# Patient Record
Sex: Male | Born: 1985 | Race: White | Hispanic: No | Marital: Single | Smoking: Never smoker
Health system: Southern US, Community
[De-identification: ages and names within clinical notes are randomized; demographics above are authoritative.]

## PROBLEM LIST (undated history)

## (undated) ENCOUNTER — Emergency Department (HOSPITAL_COMMUNITY): Admission: EM | Payer: 59 | Source: Home / Self Care

## (undated) DIAGNOSIS — I82409 Acute embolism and thrombosis of unspecified deep veins of unspecified lower extremity: Secondary | ICD-10-CM

## (undated) DIAGNOSIS — M549 Dorsalgia, unspecified: Secondary | ICD-10-CM

## (undated) HISTORY — DX: Acute embolism and thrombosis of unspecified deep veins of unspecified lower extremity: I82.409

---
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
## 2009-08-05 ENCOUNTER — Emergency Department (HOSPITAL_COMMUNITY): Admission: EM | Admit: 2009-08-05 | Discharge: 2009-08-05 | Payer: Self-pay | Admitting: Family Medicine

## 2011-03-24 LAB — GC/CHLAMYDIA PROBE AMP, GENITAL
Chlamydia, DNA Probe: POSITIVE — AB
GC Probe Amp, Genital: NEGATIVE

## 2019-02-08 ENCOUNTER — Emergency Department (HOSPITAL_COMMUNITY)
Admission: EM | Admit: 2019-02-08 | Discharge: 2019-02-08 | Disposition: A | Discharge status: Home / Self Care | Payer: 59 | Attending: Emergency Medicine | Admitting: Emergency Medicine

## 2019-02-08 ENCOUNTER — Emergency Department (HOSPITAL_COMMUNITY): Payer: 59

## 2019-02-08 ENCOUNTER — Other Ambulatory Visit: Payer: Self-pay

## 2019-02-08 ENCOUNTER — Encounter (HOSPITAL_COMMUNITY): Payer: Self-pay | Admitting: Emergency Medicine

## 2019-02-08 DIAGNOSIS — J09X2 Influenza due to identified novel influenza A virus with other respiratory manifestations: Secondary | ICD-10-CM | POA: Diagnosis not present

## 2019-02-08 DIAGNOSIS — F17200 Nicotine dependence, unspecified, uncomplicated: Secondary | ICD-10-CM | POA: Diagnosis not present

## 2019-02-08 DIAGNOSIS — J029 Acute pharyngitis, unspecified: Secondary | ICD-10-CM | POA: Diagnosis present

## 2019-02-08 DIAGNOSIS — J101 Influenza due to other identified influenza virus with other respiratory manifestations: Secondary | ICD-10-CM

## 2019-02-08 HISTORY — DX: Dorsalgia, unspecified: M54.9

## 2019-02-08 LAB — INFLUENZA PANEL BY PCR (TYPE A & B)
Influenza A By PCR: POSITIVE — AB
Influenza B By PCR: NEGATIVE

## 2019-02-08 LAB — GROUP A STREP BY PCR: Group A Strep by PCR: NOT DETECTED

## 2019-02-08 MED ORDER — ONDANSETRON 4 MG PO TBDP: 4.0000 mg | ORAL_TABLET | Freq: Three times a day (TID) | ORAL | 0 refills | Status: DC | PRN

## 2019-02-08 MED ORDER — OSELTAMIVIR PHOSPHATE 75 MG PO CAPS: 75.0000 mg | ORAL_CAPSULE | Freq: Two times a day (BID) | ORAL | 0 refills | Status: DC

## 2019-02-08 MED ORDER — IBUPROFEN 400 MG PO TABS
400.0000 mg | ORAL_TABLET | Freq: Once | ORAL | Status: AC
  Administered 2019-02-08: 400 mg via ORAL
  Filled 2019-02-08: qty 1

## 2019-02-08 MED ORDER — IBUPROFEN 400 MG PO TABS
ORAL_TABLET | ORAL | Status: AC
  Administered 2019-02-08: 400 mg via ORAL
  Filled 2019-02-08: qty 1

## 2019-02-08 NOTE — Discharge Instructions (Signed)
Take over the counter tylenol and ibuprofen, as directed on packaging, as needed for discomfort.  Gargle with warm water several times per day to help with discomfort.  May also use over the counter sore throat pain medicines such as chloraseptic or sucrets, as directed on packaging, as needed for discomfort. Take the prescriptions as directed.  Increase your fluid intake (ie:  Gatoraide) for the next few days.  Eat a bland diet and advance to your regular diet slowly as you can tolerate it. Call your regular medical doctor Monday to schedule a follow up appointment this week.  Return to the Emergency Department immediately sooner if worsening.  °

## 2019-02-08 NOTE — ED Triage Notes (Signed)
Pt states he has been having cough, sore throat, fever, body aches, and vomiting since Friday.  Girlfriend sick with same.

## 2019-02-08 NOTE — ED Provider Notes (Signed)
[facility] EMERGENCY DEPARTMENT Provider Note   CSN: [id number] Arrival date & time: 02/08/19  5701    History   Chief Complaint Chief Complaint  Patient presents with  . Generalized Body Aches    HPI Oscar Kaiser is a 33 y.o. male.     HPI  Pt was seen at 0755.  Per pt, c/o gradual onset and persistence of constant sore throat, runny/stuffy nose, sinus congestion, and cough for the past 2 days. Has been associated with several episodes of N/V, decreased appetite, generalized body aches/fatigue, and "chills." Pt states coughing is aggravating his chronic back pain.  Pt did not receive his flu shot this year. Pt's significant other with similar symptoms. Denies rash, no CP/SOB, no diarrhea, no black or blood in stools or emesis, no abd pain.    Past Medical History:  Diagnosis Date  . Back pain     There are no active problems to display for this patient.   History reviewed. No pertinent surgical history.      Home Medications    Prior to Admission medications   Not on File    Family History History reviewed. No pertinent family history.  Social History Social History   Tobacco Use  . Smoking status: Current Some Day Smoker  . Smokeless tobacco: Never Used  Substance Use Topics  . Alcohol use: Never    Frequency: Never  . Drug use: Never     Allergies   Patient has no known allergies.   Review of Systems Review of Systems ROS: Statement: All systems negative except as marked or noted in the HPI; Constitutional: Negative for objective fever and +chills, generalized body aches/fatigue.. ; ; Eyes: Negative for eye pain, redness and discharge. ; ; ENMT: Negative for ear pain, hoarseness, +nasal congestion, sinus pressure and sore throat. ; ; Cardiovascular: Negative for chest pain, palpitations, diaphoresis, dyspnea and peripheral edema. ; ; Respiratory: +cough. Negative for wheezing and stridor. ; ; Gastrointestinal: +N/V. Negative for diarrhea,  abdominal pain, blood in stool, hematemesis, jaundice and rectal bleeding. . ; ; Genitourinary: Negative for dysuria, flank pain and hematuria. ; ; Musculoskeletal: +chronic back pain. Negative for neck pain. Negative for swelling and trauma.; ; Skin: Negative for pruritus, rash, abrasions, blisters, bruising and skin lesion.; ; Neuro: Negative for headache, lightheadedness and neck stiffness. Negative for weakness, altered level of consciousness, altered mental status, extremity weakness, paresthesias, involuntary movement, seizure and syncope.       Physical Exam Updated Vital Signs BP 133/84 (BP Location: Right Arm)   Pulse 80   Temp (!) 100.6 F (38.1 C) (Oral)   Resp 18   Ht 6' (1.829 m)   Wt 95.3 kg   SpO2 100%   BMI 28.48 kg/m   Physical Exam 0800: Physical examination:  Nursing notes reviewed; Vital signs and O2 SAT reviewed;  Constitutional: Well developed, Well nourished, Well hydrated, In no acute distress; Head:  Normocephalic, atraumatic; Eyes: EOMI, PERRL, No scleral icterus; ENMT: TM's clear bilat. +edemetous nasal turbinates bilat with clear rhinorrhea. Mouth and pharynx without lesions. No tonsillar exudates. No intra-oral edema. No submandibular or sublingual edema. No hoarse voice, no drooling, no stridor. No pain with manipulation of larynx. No trismus. Mouth and pharynx normal, Mucous membranes moist; Neck: Supple, Full range of motion, No lymphadenopathy; Cardiovascular: Regular rate and rhythm, No gallop; Respiratory: Breath sounds clear & equal bilaterally, No wheezes.  Speaking full sentences with ease, Normal respiratory effort/excursion; Chest: Nontender, Movement normal; Abdomen: Soft,  Nontender, Nondistended, Normal bowel sounds; Genitourinary: No CVA tenderness; Spine:  No midline CS, TS, LS tenderness.;; Extremities: Peripheral pulses normal, No tenderness, No edema, No calf edema or asymmetry.; Neuro: AA&Ox3, Major CN grossly intact.  Speech clear. No gross focal  motor or sensory deficits in extremities.; Skin: Color normal, Warm, Dry.   ED Treatments / Results  Labs (all labs ordered are listed, but only abnormal results are displayed)   EKG None  Radiology   Procedures Procedures (including critical care time)  Medications Ordered in ED Medications  ibuprofen (ADVIL,MOTRIN) tablet 400 mg (400 mg Oral Given 02/08/19 0805)     Initial Impression / Assessment and Plan / ED Course  I have reviewed the triage vital signs and the nursing notes.  Pertinent labs & imaging results that were available during my care of the patient were reviewed by me and considered in my medical decision making (see chart for details).     MDM Reviewed: previous chart, nursing note and vitals Interpretation: labs and x-ray   Results for orders placed or performed during the hospital encounter of 02/08/19  Group A Strep by PCR  Result Value Ref Range   Group A Strep by PCR NOT DETECTED NOT DETECTED  Influenza panel by PCR (type A & B)  Result Value Ref Range   Influenza A By PCR POSITIVE (A) NEGATIVE   Influenza B By PCR NEGATIVE NEGATIVE   Dg Chest 2 View Result Date: 02/08/2019 CLINICAL DATA:  Cough sore throat body aches. EXAM: CHEST - 2 VIEW COMPARISON:  None. FINDINGS: Cardiomediastinal silhouette is normal. Mediastinal contours appear intact. There is no evidence of focal airspace consolidation, pleural effusion or pneumothorax. Osseous structures are without acute abnormality. Soft tissues are grossly normal. IMPRESSION: No active cardiopulmonary disease. Electronically Signed   By: Ted Mcalpine M.D.   On: 02/08/2019 07:40    0930:  Pt has tol PO well while in the ED without N/V. Pt symptomatically at this time. Dx and testing d/w pt.  Questions answered.  Verb understanding, agreeable to d/c home with outpt f/u.   Final Clinical Impressions(s) / ED Diagnoses   Final diagnoses:  None    ED Discharge Orders    None         Samuel Jester, DO 02/13/19 6644

## 2019-03-20 ENCOUNTER — Emergency Department (HOSPITAL_COMMUNITY): Payer: 59 | Admitting: Certified Registered Nurse Anesthetist

## 2019-03-20 ENCOUNTER — Other Ambulatory Visit: Payer: Self-pay

## 2019-03-20 ENCOUNTER — Encounter (HOSPITAL_COMMUNITY): Payer: Self-pay | Admitting: Certified Registered Nurse Anesthetist

## 2019-03-20 ENCOUNTER — Encounter (HOSPITAL_COMMUNITY)
Admission: EM | Disposition: A | Discharge status: Home / Self Care | Payer: Self-pay | Source: Home / Self Care | Attending: Emergency Medicine

## 2019-03-20 ENCOUNTER — Emergency Department (HOSPITAL_COMMUNITY)
Admission: EM | Admit: 2019-03-20 | Discharge: 2019-03-20 | Disposition: A | Discharge status: Home / Self Care | Payer: 59 | Attending: Urology | Admitting: Urology

## 2019-03-20 DIAGNOSIS — S3730XA Unspecified injury of urethra, initial encounter: Secondary | ICD-10-CM

## 2019-03-20 DIAGNOSIS — R310 Gross hematuria: Secondary | ICD-10-CM | POA: Insufficient documentation

## 2019-03-20 DIAGNOSIS — F17200 Nicotine dependence, unspecified, uncomplicated: Secondary | ICD-10-CM | POA: Insufficient documentation

## 2019-03-20 DIAGNOSIS — X58XXXA Exposure to other specified factors, initial encounter: Secondary | ICD-10-CM | POA: Diagnosis not present

## 2019-03-20 DIAGNOSIS — S39840A Fracture of corpus cavernosum penis, initial encounter: Secondary | ICD-10-CM

## 2019-03-20 HISTORY — PX: CYSTOSCOPY: SHX5120

## 2019-03-20 HISTORY — PX: REPAIR OF FRACTURED PENIS: SHX6063

## 2019-03-20 SURGERY — REPAIR, FRACTURE, PENIS
Anesthesia: General | Site: Urethra

## 2019-03-20 MED ORDER — CEFAZOLIN SODIUM-DEXTROSE 2-4 GM/100ML-% IV SOLN
2.0000 g | INTRAVENOUS | Status: AC
  Administered 2019-03-20: 2 g via INTRAVENOUS
  Filled 2019-03-20: qty 100

## 2019-03-20 MED ORDER — MIDAZOLAM HCL 2 MG/2ML IJ SOLN
INTRAMUSCULAR | Status: AC
  Filled 2019-03-20: qty 2

## 2019-03-20 MED ORDER — CEFAZOLIN (ANCEF) 1 G IV SOLR: 2.0000 g | INTRAVENOUS | Status: DC

## 2019-03-20 MED ORDER — PROMETHAZINE HCL 25 MG/ML IJ SOLN: 6.2500 mg | INTRAMUSCULAR | Status: DC | PRN

## 2019-03-20 MED ORDER — FENTANYL CITRATE (PF) 250 MCG/5ML IJ SOLN
INTRAMUSCULAR | Status: AC
Start: 2019-03-20 — End: ?
  Filled 2019-03-20: qty 5

## 2019-03-20 MED ORDER — LIDOCAINE 2% (20 MG/ML) 5 ML SYRINGE
INTRAMUSCULAR | Status: AC
  Filled 2019-03-20: qty 5

## 2019-03-20 MED ORDER — ONDANSETRON HCL 4 MG/2ML IJ SOLN
INTRAMUSCULAR | Status: AC
  Filled 2019-03-20: qty 2

## 2019-03-20 MED ORDER — LACTATED RINGERS IV SOLN
INTRAVENOUS | Status: DC | PRN
  Administered 2019-03-20 (×2): via INTRAVENOUS

## 2019-03-20 MED ORDER — ROCURONIUM BROMIDE 100 MG/10ML IV SOLN
INTRAVENOUS | Status: AC
  Filled 2019-03-20: qty 1

## 2019-03-20 MED ORDER — PROMETHAZINE HCL 25 MG/ML IJ SOLN
INTRAMUSCULAR | Status: AC
  Administered 2019-03-20: 09:00:00 25 mg via INTRAMUSCULAR
  Filled 2019-03-20: qty 1

## 2019-03-20 MED ORDER — SODIUM CHLORIDE (PF) 0.9 % IJ SOLN
INTRAMUSCULAR | Status: AC
  Filled 2019-03-20: qty 100

## 2019-03-20 MED ORDER — SUCCINYLCHOLINE CHLORIDE 200 MG/10ML IV SOSY
PREFILLED_SYRINGE | INTRAVENOUS | Status: AC
  Filled 2019-03-20: qty 10

## 2019-03-20 MED ORDER — OXYCODONE HCL 5 MG PO TABS: 5.0000 mg | ORAL_TABLET | Freq: Once | ORAL | Status: DC | PRN

## 2019-03-20 MED ORDER — OXYCODONE HCL 5 MG/5ML PO SOLN: 5.0000 mg | Freq: Once | ORAL | Status: DC | PRN

## 2019-03-20 MED ORDER — ACETAMINOPHEN 10 MG/ML IV SOLN
INTRAVENOUS | Status: AC
  Administered 2019-03-20: 1000 mg via INTRAVENOUS
  Filled 2019-03-20: qty 100

## 2019-03-20 MED ORDER — HYDROMORPHONE HCL 1 MG/ML IJ SOLN
0.2500 mg | INTRAMUSCULAR | Status: DC | PRN
  Administered 2019-03-20 (×2): 0.5 mg via INTRAVENOUS

## 2019-03-20 MED ORDER — BUPIVACAINE-EPINEPHRINE (PF) 0.25% -1:200000 IJ SOLN
INTRAMUSCULAR | Status: AC
  Filled 2019-03-20: qty 30

## 2019-03-20 MED ORDER — SUCCINYLCHOLINE CHLORIDE 200 MG/10ML IV SOSY
PREFILLED_SYRINGE | INTRAVENOUS | Status: DC | PRN
  Administered 2019-03-20: 120 mg via INTRAVENOUS

## 2019-03-20 MED ORDER — BUPIVACAINE HCL (PF) 0.25 % IJ SOLN
INTRAMUSCULAR | Status: AC
  Filled 2019-03-20: qty 30

## 2019-03-20 MED ORDER — 0.9 % SODIUM CHLORIDE (POUR BTL) OPTIME
TOPICAL | Status: DC | PRN
  Administered 2019-03-20: 1000 mL

## 2019-03-20 MED ORDER — LIDOCAINE 2% (20 MG/ML) 5 ML SYRINGE
INTRAMUSCULAR | Status: DC | PRN
  Administered 2019-03-20: 60 mg via INTRAVENOUS

## 2019-03-20 MED ORDER — PROPOFOL 10 MG/ML IV BOLUS
INTRAVENOUS | Status: AC
  Filled 2019-03-20: qty 40

## 2019-03-20 MED ORDER — DEXAMETHASONE SODIUM PHOSPHATE 10 MG/ML IJ SOLN
INTRAMUSCULAR | Status: AC
  Filled 2019-03-20: qty 1

## 2019-03-20 MED ORDER — BUPIVACAINE HCL (PF) 0.25 % IJ SOLN
INTRAMUSCULAR | Status: DC | PRN
  Administered 2019-03-20: 10 mL

## 2019-03-20 MED ORDER — DEXAMETHASONE SODIUM PHOSPHATE 4 MG/ML IJ SOLN
INTRAMUSCULAR | Status: DC | PRN
  Administered 2019-03-20: 10 mg via INTRAVENOUS

## 2019-03-20 MED ORDER — MEPERIDINE HCL 50 MG/ML IJ SOLN
INTRAMUSCULAR | Status: AC
  Filled 2019-03-20: qty 1

## 2019-03-20 MED ORDER — PROPOFOL 10 MG/ML IV BOLUS
INTRAVENOUS | Status: DC | PRN
  Administered 2019-03-20: 200 mg via INTRAVENOUS

## 2019-03-20 MED ORDER — ACETAMINOPHEN 10 MG/ML IV SOLN
1000.0000 mg | Freq: Once | INTRAVENOUS | Status: AC
  Administered 2019-03-20: 1000 mg via INTRAVENOUS

## 2019-03-20 MED ORDER — FENTANYL CITRATE (PF) 100 MCG/2ML IJ SOLN
INTRAMUSCULAR | Status: DC | PRN
  Administered 2019-03-20 (×2): 50 ug via INTRAVENOUS
  Administered 2019-03-20: 100 ug via INTRAVENOUS
  Administered 2019-03-20: 50 ug via INTRAVENOUS

## 2019-03-20 MED ORDER — SODIUM CHLORIDE 0.9 % IR SOLN
Status: DC | PRN
  Administered 2019-03-20: 3000 mL via INTRAVESICAL

## 2019-03-20 MED ORDER — CEFAZOLIN SODIUM-DEXTROSE 2-4 GM/100ML-% IV SOLN
INTRAVENOUS | Status: AC
  Filled 2019-03-20: qty 100

## 2019-03-20 MED ORDER — SODIUM CHLORIDE (PF) 0.9 % IJ SOLN
INTRAMUSCULAR | Status: DC | PRN
  Administered 2019-03-20: 60 mL

## 2019-03-20 MED ORDER — HYDROMORPHONE HCL 1 MG/ML IJ SOLN
INTRAMUSCULAR | Status: AC
  Administered 2019-03-20: 0.5 mg via INTRAVENOUS
  Filled 2019-03-20: qty 1

## 2019-03-20 MED ORDER — STERILE WATER FOR IRRIGATION IR SOLN
Status: DC | PRN
  Administered 2019-03-20: 250 mL

## 2019-03-20 MED ORDER — SUGAMMADEX SODIUM 200 MG/2ML IV SOLN
INTRAVENOUS | Status: AC
  Filled 2019-03-20: qty 2

## 2019-03-20 MED ORDER — PROMETHAZINE HCL 25 MG/ML IJ SOLN
25.0000 mg | Freq: Once | INTRAMUSCULAR | Status: AC
  Administered 2019-03-20: 09:00:00 25 mg via INTRAMUSCULAR

## 2019-03-20 MED ORDER — SUGAMMADEX SODIUM 200 MG/2ML IV SOLN
INTRAVENOUS | Status: DC | PRN
  Administered 2019-03-20: 200 mg via INTRAVENOUS

## 2019-03-20 MED ORDER — PROMETHAZINE HCL 25 MG/ML IJ SOLN
INTRAMUSCULAR | Status: AC
  Filled 2019-03-20: qty 1

## 2019-03-20 MED ORDER — CEPHALEXIN 500 MG PO CAPS: 500.0000 mg | ORAL_CAPSULE | Freq: Two times a day (BID) | ORAL | 0 refills | Status: AC

## 2019-03-20 MED ORDER — OXYCODONE-ACETAMINOPHEN 5-325 MG PO TABS: 1.0000 | ORAL_TABLET | Freq: Four times a day (QID) | ORAL | 0 refills | Status: DC | PRN

## 2019-03-20 MED ORDER — ONDANSETRON HCL 4 MG/2ML IJ SOLN
INTRAMUSCULAR | Status: DC | PRN
  Administered 2019-03-20: 4 mg via INTRAVENOUS

## 2019-03-20 MED ORDER — GLYCOPYRROLATE PF 0.2 MG/ML IJ SOSY
PREFILLED_SYRINGE | INTRAMUSCULAR | Status: AC
  Filled 2019-03-20: qty 1

## 2019-03-20 MED ORDER — MIDAZOLAM HCL 5 MG/5ML IJ SOLN
INTRAMUSCULAR | Status: DC | PRN
  Administered 2019-03-20: 2 mg via INTRAVENOUS

## 2019-03-20 MED ORDER — ROCURONIUM BROMIDE 10 MG/ML (PF) SYRINGE
PREFILLED_SYRINGE | INTRAVENOUS | Status: DC | PRN
  Administered 2019-03-20: 50 mg via INTRAVENOUS

## 2019-03-20 MED ORDER — MEPERIDINE HCL 50 MG/ML IJ SOLN
6.2500 mg | INTRAMUSCULAR | Status: DC | PRN
  Administered 2019-03-20: 12.5 mg via INTRAVENOUS

## 2019-03-20 SURGICAL SUPPLY — 41 items
BAG URINE DRAINAGE (UROLOGICAL SUPPLIES) ×4 IMPLANT
BLADE SURG 15 STRL LF DISP TIS (BLADE) ×2 IMPLANT
BLADE SURG 15 STRL SS (BLADE) ×2
BNDG COHESIVE 1X5 TAN STRL LF (GAUZE/BANDAGES/DRESSINGS) ×4 IMPLANT
BNDG CONFORM 4 STRL LF (GAUZE/BANDAGES/DRESSINGS) ×4 IMPLANT
BRIEF STRETCH FOR OB PAD LRG (UNDERPADS AND DIAPERS) ×4 IMPLANT
CATH FOLEY 2WAY SLVR  5CC 16FR (CATHETERS) ×2
CATH FOLEY 2WAY SLVR 5CC 16FR (CATHETERS) ×2 IMPLANT
DRAIN PENROSE 18X1/2 LTX STRL (DRAIN) ×4 IMPLANT
DRESSING TELFA ISLAND 4X8 (GAUZE/BANDAGES/DRESSINGS) ×4 IMPLANT
DRSG KUZMA FLUFF (GAUZE/BANDAGES/DRESSINGS) ×4 IMPLANT
ELECT PENCIL ROCKER SW 15FT (MISCELLANEOUS) ×4 IMPLANT
ELECT REM PT RETURN 15FT ADLT (MISCELLANEOUS) ×4 IMPLANT
GAUZE PETROLATUM 1 X8 (GAUZE/BANDAGES/DRESSINGS) ×4 IMPLANT
GAUZE SPONGE 4X4 12PLY STRL (GAUZE/BANDAGES/DRESSINGS) ×4 IMPLANT
GLOVE BIOGEL M STRL SZ7.5 (GLOVE) ×4 IMPLANT
GLOVE BIOGEL PI IND STRL 7.5 (GLOVE) ×2 IMPLANT
GLOVE BIOGEL PI INDICATOR 7.5 (GLOVE) ×2
GLOVE SURG SS PI 8.0 STRL IVOR (GLOVE) ×8 IMPLANT
GOWN STRL REIN XL XLG (GOWN DISPOSABLE) ×4 IMPLANT
GOWN STRL REUS W/TWL XL LVL3 (GOWN DISPOSABLE) ×4 IMPLANT
GUIDEWIRE STR DUAL SENSOR (WIRE) ×4 IMPLANT
KIT BASIN OR (CUSTOM PROCEDURE TRAY) ×4 IMPLANT
KIT TURNOVER KIT A (KITS) ×4 IMPLANT
NDL SAFETY ECLIPSE 18X1.5 (NEEDLE) ×2 IMPLANT
NEEDLE HYPO 18GX1.5 SHARP (NEEDLE) ×2
NEEDLE HYPO 22GX1.5 SAFETY (NEEDLE) ×4 IMPLANT
PACK BASIC VI WITH GOWN DISP (CUSTOM PROCEDURE TRAY) ×4 IMPLANT
PLUG CATH AND CAP STER (CATHETERS) ×4 IMPLANT
SPONGE LAP 4X18 RFD (DISPOSABLE) ×4 IMPLANT
SUT CHROMIC 3 0 PS 2 (SUTURE) IMPLANT
SUT CHROMIC 3 0 SH 27 (SUTURE) ×8 IMPLANT
SUT SILK 2 0 (SUTURE)
SUT SILK 2-0 18XBRD TIE 12 (SUTURE) IMPLANT
SUT VIC AB 2-0 SH 27 (SUTURE) ×2
SUT VIC AB 2-0 SH 27X BRD (SUTURE) ×2 IMPLANT
SUT VICRYL 3-0 RB1 18 ABS (SUTURE) ×4 IMPLANT
SYR 20CC LL (SYRINGE) ×4 IMPLANT
SYR 50ML LL SCALE MARK (SYRINGE) ×4 IMPLANT
SYR BULB IRRIGATION 50ML (SYRINGE) ×4 IMPLANT
SYR CONTROL 10ML LL (SYRINGE) ×4 IMPLANT

## 2019-03-20 NOTE — Op Note (Addendum)
Preoperative diagnosis: Penile swelling, gross hematuria Postoperative diagnosis: Penile fracture/corporal injury, urethral injury  Procedure: Flexible cystoscopy, penile exploration through circumcision, repair of complex penile injury including the right and left corpora and repair of the urethra  Surgeon: Junious Silk  Anesthesia: General  Indication for procedure: 33 year old male who was having sex with partner on top and had sudden penile pain, bending of penis, penile swelling ecchymosis and blood per meatus  Findings:  The penis was swollen with along the shaft and then the distal penis was significantly swollen and then bent about 90 to the left.  This ended up being from the right corporal rupture and hematoma.  The ventral penis and foreskin were the most swollen.  On flexible cystoscopy there was an injury of the penile urethra and I could not locate the proximal lumen despite cystoscopy and probing with a sensor wire.  A 16 French Foley catheter passed without difficulty and drained about 400 cc of urine.  The right corpora was torn in the mid penile urethra from about 2:00 around to 10:00 the urethra was torn from about 4:00 around to 1:00 and the left corpora was torn medially from 8:00 up to about 12:00.    He had a good straight erection on artificial erection after repair.  Description of procedure: After consent was obtained patient brought to the operating room.  After adequate anesthesia the external genitalia and lower abdomen were prepped and draped in the usual sterile fashion.  A timeout was performed to confirm the patient and procedure.  Testicles of the cystoscope per urethra and met an area of injury.  I could not locate a true lumen.  I thought I could see 1 superiorly or dorsally and tried to pass a sensor wire but it would not advance normally.  Therefore attention was turned to the penis and I drew a line around his prior circumcision scar.  A 10 cc plain Marcaine  dorsal penile block was given.  At the 6:00 ventral position the frenular line did not line up completely it was off by about 5 mm.  This was from his prior circumcision.  Because of the swelling it was easy to make an incision through the skin to bring this down through the dartos fascia.  I then decided to place a Foley catheter so I can at least palpate the distal urethra ventrally.  The Foley catheter actually advanced without difficulty and into the bladder.  It drained light red urine, but very thin and no clots.  The balloon was filled to 10 cc and it was seated at the bladder neck.  Having again urethral access we then dissected the dartos fascia and it quickly became apparent there was clot and a tear in the right corpora ventrally then the catheter was visible and on the other side of the urethra a small tear in the left corpora.  At these areas were oozing and so I used a Penrose drain to place a proximal penile tourniquet.  The injury was irrigated which allowed good visualization of the injury.  I started with 3-0 Vicryl suture in place and interrupted sutures in the left corpora.  These were tied down sequentially and this close the left corpora adequately.  Next, I decided to close the right corpora.  It was reapproximated with interrupted 3-0 Vicryl suture which re-created the right corpora.  The sutures were tied down sequentially. I then noted the left side of the urethra was intact so I carefully used the  needle of the 3-0 Vicryl to get the mucosa of the dorsal urethral plate proximally and then distally to re-create the 12 o'clock position.  I then used interrupted 3-0 Vicryl suture to reapproximate the remainder of the urethra.  I was happy with the repair.  I then used a 60 cc syringe of injectable saline and an 18-gauge needle into the left corpora and induced an artificial erection.  This created a good straight erection which is some mild oozing at the right corpora.  I placed another  interrupted suture and this corrected any leak.  I then irrigated the tissues again and could see where Buck's fascia was torn just from the left side across the urethra and over to the right corpora just distal to the repair.  Therefore I used another 3-0 Vicryl and reapproximated Buck's fascia to close it over the corpora and urethra providing a second layer of security.  I then released the tourniquet and hemostasis was adequate.  The wound was irrigated and because the dartos fascia was quite edematous I loosely reapproximated laterally and ventrally.  This began the line of the repair and I found my prior marks at the 12:00 and 6 o'clock position and tried to reapproximate the circumcision with interrupted 3-0 chromic suture the best I could.  It was quite swollen and still cocked to the left a bit even though the artificial erection had been straight.  The penis was irrigated and cleaned one last time.  I placed a Telfa around the incision and a loose clean dressing.  The Foley catheter was left to gravity drainage.  Fluffs and mesh underwear were placed.  He was awakened and taken to the recovery room in stable condition.  Complication: None  Blood loss: 50 cc  Drains: 16 French Foley catheter  Specimens: None  Disposition: Patient stable to PACU.  Called and discussed with his girlfriend Loma Sousa and we went over the procedure and all postop care including wound shower, medications and activity limitations with no sexual activity for 6 weeks.

## 2019-03-20 NOTE — ED Triage Notes (Signed)
Pt states he was having sex and feels he injured his penis, had some bleeding, none now.

## 2019-03-20 NOTE — H&P (Addendum)
H&P  Chief Complaint: Penile swelling, ecchymosis and blood per meatus  History of Present Illness: [name] is a 33 year old male who presents with the acute onset of penile swelling ecchymosis and blood per meatus.  He works second shift and after work had dinner, smoked some marijuana and then was having sex.  His partner was on top and he missed the vagina and had a sudden onset of severe penile pain, swelling and "blood went everywhere".  He notes blood per meatus.  He has not been able to void since the injury.  He has no prior urologic history or surgical history.  He continues to have significant penile pain swelling and inability to void.  Modifying factors: There are no other modifying factors  Associated signs and symptoms: There are no other associated signs and symptoms Aggravating and relieving factors: There are no other aggravating or relieving factors Severity: Moderate Duration: Persistent  Past Medical History:  Diagnosis Date  . Back pain    No past surgical history on file.  Home Medications:  (Not in a hospital admission)  Allergies: No Known Allergies  No family history on file. Social History:  reports that he has been smoking. He has never used smokeless tobacco. He reports that he does not drink alcohol or use drugs.  ROS: A complete review of systems was performed.  All systems are negative except for pertinent findings as noted. Review of Systems  All other systems reviewed and are negative.    Physical Exam:  Vital signs in last 24 hours: Temp:  [98.6 F (37 C)] 98.6 F (37 C) (04/03 0132) Pulse Rate:  [87] 87 (04/03 0132) Resp:  [18] 18 (04/03 0132) BP: (135)/(79) 135/79 (04/03 0132) SpO2:  [98 %] 98 % (04/03 0132) Weight:  [90.7 kg] 90.7 kg (04/03 0131) General:  Alert and oriented, No acute distress HEENT: Normocephalic, atraumatic Neck: No JVD or lymphadenopathy Cardiovascular: Regular rate and rhythm Lungs: Regular rate and  effort Abdomen: Soft, nontender, nondistended, no abdominal masses Back: No CVA tenderness Extremities: No edema Neurologic: Grossly intact GU: The penis 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  Prior circumcision scar noted.  Ecchymosis and focal tenderness on the left shaft.  Purple old blood/clot at meatus.  Laboratory Data:  No results found for this or any previous visit (from the past 24 hour(s)). No results found for this or any previous visit (from the past 240 hour(s)). Creatinine: No results for input(s): CREATININE in the last 168 hours.  Impression/Assessment:  Penile swelling, ecchymosis and blood per meatus- patient with difficulty voiding.  We discussed concern for penile and urethral injury.  Plan:  I discussed with the patient the nature, potential benefits, risks and alternatives to penile exploration via a circumcision and flexible cystoscopy, including side effects of the proposed treatment, the likelihood of the patient achieving the goals of the procedure, and any potential problems that might occur during the procedure or recuperation. We also discussed the risk of proceeding with possible exposure to novel coronavirus and the real risk of delay, including the expectation that a delay of 6-8 weeks or more may be required to emerge from an environment in which COVID-19 is less prevalent.  I recommended we proceed immediately.  Also discussed he may need a Foley catheter up to several weeks.  We discussed risks such as penile curvature and erectile dysfunction among others the degree to which should be lessened by formal repair if  an injury or tear is noted. All questions answered. Patient elects to proceed.  Addendum: Pt seen in pre-op area. He tried to void and have a BM but noted more penile swelling and pain. He was not able to pass urine. We discussed he may need an SP tube and I showed him where that goes. We discussed additional risk of  urethral stricture or fistula among others. All questions answered. He elects to proceed. I noted a small amount of Liquifed but thick dark blood emanating from meatus.   Jerilee Field 03/20/2019, 3:48 AM

## 2019-03-20 NOTE — ED Provider Notes (Signed)
[facility] EMERGENCY DEPARTMENT Provider Note   CSN: [id number] Arrival date & time: 03/20/19  0124    History   Chief Complaint Chief Complaint  Patient presents with  . Penis Injury    HPI Oscar Kaiser is a 33 y.o. male.  The history is provided by the patient.  He remains he was having sexual intercourse when his penis bent and he had sudden onset of pain and some bleeding.  Since then, he has noted that the penis is swollen.  Injury occurred about 30 minutes ago.  Past Medical History:  Diagnosis Date  . Back pain     There are no active problems to display for this patient.   No past surgical history on file.      Home Medications    Prior to Admission medications   Medication Sig Start Date End Date Taking? Authorizing Provider  ondansetron (ZOFRAN ODT) 4 MG disintegrating tablet Take 1 tablet (4 mg total) by mouth every 8 (eight) hours as needed for nausea or vomiting. 02/08/19   Samuel Jester, DO  oseltamivir (TAMIFLU) 75 MG capsule Take 1 capsule (75 mg total) by mouth every 12 (twelve) hours. 02/08/19   Samuel Jester, DO    Family History No family history on file.  Social History Social History   Tobacco Use  . Smoking status: Current Some Day Smoker  . Smokeless tobacco: Never Used  Substance Use Topics  . Alcohol use: Never    Frequency: Never  . Drug use: Never     Allergies   Patient has no known allergies.   Review of Systems Review of Systems  All other systems reviewed and are negative.    Physical Exam Updated Vital Signs BP 135/79 (BP Location: Left Arm)   Pulse 87   Temp 98.6 F (37 C) (Oral)   Resp 18   Ht 6' (1.829 m)   Wt 90.7 kg   SpO2 98%   BMI 27.12 kg/m   Physical Exam Vitals signs and nursing note reviewed.    33 year old male, resting comfortably and in no acute distress. Vital signs are normal. Oxygen saturation is 98%, which is normal. Head is normocephalic and atraumatic. PERRLA, EOMI.  Oropharynx is clear. Neck is nontender and supple without adenopathy or JVD. Back is nontender and there is no CVA tenderness. Lungs are clear without rales, wheezes, or rhonchi. Chest is nontender. Heart has regular rate and rhythm without murmur. Abdomen is soft, flat, nontender without masses or hepatosplenomegaly and peristalsis is normoactive. Genitalia: Circumcised penis.  Marked swelling of the shaft of the penis with generalized tenderness.  Testes descended without masses. Extremities have no cyanosis or edema, full range of motion is present. Skin is warm and dry without rash. Neurologic: Mental status is normal, cranial nerves are intact, there are no motor or sensory deficits.  ED Treatments / Results  Labs (all labs ordered are listed, but only abnormal results are displayed) Labs Reviewed - No data to display  EKG None  Radiology No results found.  Procedures Procedures   Medications Ordered in ED Medications - No data to display   Initial Impression / Assessment and Plan / ED Course  I have reviewed the triage vital signs and the nursing notes.  Pertinent labs & imaging results that were available during my care of the patient were reviewed by me and considered in my medical decision making (see chart for details).  Fracture of the penis.  Old records are  reviewed, and he has no relevant past visits.  Case is discussed with Dr. Mena Goes, on-call for urology, who is coming in to evaluate the patient.  Call received from Dr. Mena Goes, he has to do emergency surgery at [facility] and request the patient be transferred there.  Case has been discussed with Dr. Read Drivers, emergency department physician at [facility] who agrees to accept the patient in transfer.  He will go by private vehicle.  Final Clinical Impressions(s) / ED Diagnoses   Final diagnoses:  Fracture of erect penis    ED Discharge Orders    None       Dione Booze,  MD 03/20/19 0211

## 2019-03-20 NOTE — Transfer of Care (Signed)
Immediate Anesthesia Transfer of Care Note  Patient: Oscar Kaiser  Procedure(s) Performed: Procedure(s): PENILE EXPLORATION AND REPAIR OF FRACTURED PENIS (N/A) CYSTOSCOPY FLEXIBLE (N/A)  Patient Location: PACU  Anesthesia Type:General  Level of Consciousness:  sedated, patient cooperative and responds to stimulation  Airway & Oxygen Therapy:Patient Spontanous Breathing and Patient connected to face mask oxgen  Post-op Assessment:  Report given to PACU RN and Post -op Vital signs reviewed and stable  Post vital signs:  Reviewed and stable  Last Vitals:  Vitals:   03/20/19 0132  BP: 135/79  Pulse: 87  Resp: 18  Temp: 37 C  SpO2: 98%    Complications: No apparent anesthesia complications

## 2019-03-20 NOTE — Anesthesia Postprocedure Evaluation (Signed)
Anesthesia Post Note  Patient: Oscar Kaiser  Procedure(s) Performed: PENILE EXPLORATION AND REPAIR OF FRACTURED PENIS (N/A Penis) CYSTOSCOPY FLEXIBLE (N/A Urethra)     Patient location during evaluation: PACU Anesthesia Type: General Level of consciousness: awake and alert Pain management: pain level controlled Vital Signs Assessment: post-procedure vital signs reviewed and stable Respiratory status: spontaneous breathing, nonlabored ventilation, respiratory function stable and patient connected to nasal cannula oxygen Cardiovascular status: blood pressure returned to baseline and stable Postop Assessment: no apparent nausea or vomiting Anesthetic complications: no    Last Vitals:  Vitals:   03/20/19 0730 03/20/19 0745  BP: (!) 147/89   Pulse: 78 77  Resp: (!) 22 14  Temp:    SpO2: 100% 97%    Last Pain:  Vitals:   03/20/19 0715  TempSrc:   PainSc: 7                  Chakara Bognar COKER

## 2019-03-20 NOTE — Anesthesia Preprocedure Evaluation (Addendum)
Anesthesia Evaluation  Patient identified by MRN, date of birth, ID band Patient awake    Reviewed: Allergy & Precautions, NPO status , Patient's Chart, lab work & pertinent test results  Airway Mallampati: II  TM Distance: >3 FB Neck ROM: Full    Dental no notable dental hx.    Pulmonary neg pulmonary ROS, Current Smoker,    Pulmonary exam normal breath sounds clear to auscultation       Cardiovascular negative cardio ROS Normal cardiovascular exam Rhythm:Regular Rate:Normal     Neuro/Psych negative neurological ROS  negative psych ROS   GI/Hepatic negative GI ROS, Neg liver ROS,   Endo/Other  negative endocrine ROS  Renal/GU negative Renal ROS  negative genitourinary   Musculoskeletal negative musculoskeletal ROS (+)   Abdominal   Peds negative pediatric ROS (+)  Hematology negative hematology ROS (+)   Anesthesia Other Findings   Reproductive/Obstetrics negative OB ROS                             Anesthesia Physical Anesthesia Plan  ASA: II and emergent  Anesthesia Plan: General   Post-op Pain Management:    Induction: Intravenous  PONV Risk Score and Plan: 1 and Ondansetron  Airway Management Planned: Oral ETT  Additional Equipment:   Intra-op Plan:   Post-operative Plan: Extubation in OR  Informed Consent: I have reviewed the patients History and Physical, chart, labs and discussed the procedure including the risks, benefits and alternatives for the proposed anesthesia with the patient or authorized representative who has indicated his/her understanding and acceptance.     Dental advisory given  Plan Discussed with: CRNA  Anesthesia Plan Comments:        Anesthesia Quick Evaluation

## 2019-03-20 NOTE — Anesthesia Procedure Notes (Signed)
Procedure Name: Intubation Date/Time: 03/20/2019 5:58 AM Performed by: Vanessa [place], CRNA Pre-anesthesia Checklist: Patient identified, Emergency Drugs available, Suction available and Patient being monitored Patient Re-evaluated:Patient Re-evaluated prior to induction Oxygen Delivery Method: Circle system utilized Preoxygenation: Pre-oxygenation with 100% oxygen Induction Type: IV induction and Rapid sequence Laryngoscope Size: 2 and Miller Grade View: Grade I Tube type: Oral Tube size: 7.5 mm Number of attempts: 1 Airway Equipment and Method: Stylet Placement Confirmation: ETT inserted through vocal cords under direct vision,  positive ETCO2 and breath sounds checked- equal and bilateral Tube secured with: Tape Dental Injury: Teeth and Oropharynx as per pre-operative assessment

## 2019-03-20 NOTE — Discharge Instructions (Addendum)
Circumcision and Penile/Urethral injury Repair, Adult, Care After This sheet gives you information about how to care for yourself after your procedure. Your doctor may also give you more specific instructions. If you have problems or questions, contact your doctor. Follow these instructions at home: Cut care   Follow instructions from your doctor about how to take care of your cut from surgery (incision). Make sure you: ? Wash your hands with soap and water before you change your bandage (dressing). If you cannot use soap and water, use hand sanitizer. ? Change your bandage as told by your doctor. ? Leave stitches (sutures), skin glue, or skin tape (adhesive) strips in place. They may need to stay in place for 2 weeks or longer. If tape strips get loose and curl up, you may trim the loose edges. Do not remove tape strips completely unless your doctor says it is okay  Check your cut area every day for signs of infection. Check for: ? More redness, swelling, or pain. ? More fluid or blood. ? Warmth. ? Pus or a bad smell. Bathing  Do not take baths, swim, or use a hot tub until your doctor says it is okay.   You may shower tomorrow, March 21, 2019. Let water run off the incision/penis and the catheter.   Do not get your cut area wet during the 24 hours after the procedure, or as long as told by your doctor.  When you can shower, do not rub the cut area. Gently pat it dry. General instructions  Avoid heavy lifting, contact sports, biking, or swimming until your doctor says it is okay.\  No sexual activity for 6 weeks   Take or apply over-the-counter and prescription medicines only as told by your doctor.  Keep all follow-up visits as told by your doctor. This is important. Contact a doctor if:  Medicine does not help your pain.  You have more redness, swelling, or pain around your cut.  You have more fluid or blood coming from your cut.  Your cut feels warm to the touch.  You  have pus or a bad smell coming from your cut.  You have a fever. Get help right away if:  You cannot pee (urinate).  It hurts to pee.  Your pain is not helped by medicines.  There is redness, swelling, and soreness that spreads up the shaft of your penis, your thighs, or your lower belly (abdomen).  You have bleeding that does not stop when you press on it. Summary  Follow instructions from your doctor about how to take care of your cut from surgery (incision).  Check your cut area every day for signs of infection.  Do not have sex until your doctor says it is okay. This information is not intended to replace advice given to you by your health care provider. Make sure you discuss any questions you have with your health care provider. Document Released: 05/21/2008 Document Revised: 12/11/2016 Document Reviewed: 12/11/2016 Elsevier Interactive Patient Education  2019 [employer]   Indwelling Urinary Catheter Care, Adult An indwelling urinary catheter is a thin tube that is put into your bladder. The tube helps to drain pee (urine) out of your body. The tube goes in through your urethra. Your urethra is where pee comes out of your body. Your pee will come out through the catheter, then it will go into a bag (drainage bag). Take good care of your catheter so it will work well. How to wear your catheter  and bag Supplies needed  Sticky tape (adhesive tape) or a leg strap.  Alcohol wipe or soap and water (if you use tape).  A clean towel (if you use tape).  Large overnight bag.  Smaller bag (leg bag). Wearing your catheter Attach your catheter to your leg with tape or a leg strap.  Make sure the catheter is not pulled tight.  If a leg strap gets wet, take it off and put on a dry strap.  If you use tape to hold the bag on your leg: 1. Use an alcohol wipe or soap and water to wash your skin where the tape made it sticky before. 2. Use a clean towel to pat-dry that  skin. 3. Use new tape to make the bag stay on your leg. Wearing your bags You should have been given a large overnight bag.  You may wear the overnight bag in the day or night.  Always have the overnight bag lower than your bladder.  Do not let the bag touch the floor.  Before you go to sleep, put a clean plastic bag in a wastebasket. Then hang the overnight bag inside the wastebasket. You should also have a smaller leg bag that fits under your clothes.  Always wear the leg bag below your knee.  Do not wear your leg bag at night. How to care for your skin and catheter Supplies needed  A clean washcloth.  Water and mild soap.  A clean towel. Caring for your skin and catheter      Clean the skin around your catheter every day: ? Wash your hands with soap and water. ? Wet a clean washcloth in warm water and mild soap. ? Clean the skin around your urethra. ? If you are male: ? Gently spread the folds of skin around your vagina (labia). ? With the washcloth in your other hand, wipe the inner side of your labia on each side. Wipe from front to back. ? If you are male: ? Pull back any skin that covers the end of your penis (foreskin). ? With the washcloth in your other hand, wipe your penis in small circles. Start wiping at the tip of your penis, then move away from the catheter. ? With your free hand, hold the catheter close to where it goes into your body. ? Keep holding the catheter during cleaning so it does not get pulled out. ? With the washcloth in your other hand, clean the catheter. ? Only wipe downward on the catheter. ? Do not wipe upward toward your body. Doing this may push germs into your urethra and cause infection. ? Use a clean towel to pat-dry the catheter and the skin around it. Make sure to wipe off all soap. ? Wash your hands with soap and water.  Shower every day. Do not take baths.  Do not use cream, ointment, or lotion on the area where the catheter  goes into your body, unless your doctor tells you to.  Do not use powders, sprays, or lotions on your genital area.  Check your skin around the catheter every day for signs of infection. Check for: ? Redness, swelling, or pain. ? Fluid or blood. ? Warmth. ? Pus or a bad smell. How to empty the bag Supplies needed  Rubbing alcohol.  Gauze pad or cotton ball.  Tape or a leg strap. Emptying the bag Pour the pee out of your bag when it is ?- full, or at least 2-3 times a  day. Do this for your overnight bag and your leg bag. 1. Wash your hands with soap and water. 2. Separate (detach) the bag from your leg. 3. Hold the bag over the toilet or a clean pail. Keep the bag lower than your hips and bladder. This is so the pee (urine) does not go back into the tube. 4. Open the pour spout. It is at the bottom of the bag. 5. Empty the pee into the toilet or pail. Do not let the pour spout touch any surface. 6. Put rubbing alcohol on a gauze pad or cotton ball. 7. Use the gauze pad or cotton ball to clean the pour spout. 8. Close the pour spout. 9. Attach the bag to your leg with tape or a leg strap. 10. Wash your hands with soap and water. Follow instructions for cleaning the drainage bag:  From the product maker.  As told by your doctor. How to change the bag Supplies needed  Alcohol wipes.  A clean bag.  Tape or a leg strap. Changing the bag Replace your bag with a clean bag once a month. If it starts to leak, smell bad, or look dirty, change it sooner. 1. Wash your hands with soap and water. 2. Separate the dirty bag from your leg. 3. Pinch the catheter with your fingers so that pee does not spill out. 4. Separate the catheter tube from the bag tube where these tubes connect (at the connection valve). Do not let the tubes touch any surface. 5. Clean the end of the catheter tube with an alcohol wipe. Use a different alcohol wipe to clean the end of the bag tube. 6. Connect the  catheter tube to the tube of the clean bag. 7. Attach the clean bag to your leg with tape or a leg strap. Do not make the bag tight on your leg. 8. Wash your hands with soap and water. General rules   Never pull on your catheter. Never try to take it out. Doing that can hurt you.  Always wash your hands before and after you touch your catheter or bag. Use a mild, fragrance-free soap. If you do not have soap and water, use hand sanitizer.  Always make sure there are no twists or bends (kinks) in the catheter tube.  Always make sure there are no leaks in the catheter or bag.  Drink enough fluid to keep your pee pale yellow.  Do not take baths, swim, or use a hot tub.  If you are male, wipe from front to back after you poop (have a bowel movement). Contact a doctor if:  Your pee is cloudy.  Your pee smells worse than usual.  Your catheter gets clogged.  Your catheter leaks.  Your bladder feels full. Get help right away if:  You have redness, swelling, or pain where the catheter goes into your body.  You have fluid, blood, pus, or a bad smell coming from the area where the catheter goes into your body.  Your skin feels warm where the catheter goes into your body.  You have a fever.  You have pain in your: ? Belly (abdomen). ? Legs. ? Lower back. ? Bladder.  You see blood in the catheter.  Your pee is pink or red.  You feel sick to your stomach (nauseous).  You throw up (vomit).  You have chills.  Your pee is not draining into the bag.  Your catheter gets pulled out. Summary  An indwelling urinary catheter  is a thin tube that is placed into the bladder to help drain pee (urine) out of the body.  The catheter is placed into the part of the body that drains pee from the bladder (urethra).  Taking good care of your catheter will keep it working properly and help prevent problems.  Always wash your hands before and after touching your catheter or  bag.  Never pull on your catheter or try to take it out. This information is not intended to replace advice given to you by your health care provider. Make sure you discuss any questions you have with your health care provider. Document Released: 03/30/2013 Document Revised: 05/26/2018 Document Reviewed: 07/19/2017 Elsevier Interactive Patient Education  2019 Elsevier Inc.   General Anesthesia, Adult, Care After This sheet gives you information about how to care for yourself after your procedure. Your health care provider may also give you more specific instructions. If you have problems or questions, contact your health care provider. What can I expect after the procedure? After the procedure, the following side effects are common:  Pain or discomfort at the IV site.  Nausea.  Vomiting.  Sore throat.  Trouble concentrating.  Feeling cold or chills.  Weak or tired.  Sleepiness and fatigue.  Soreness and body aches. These side effects can affect parts of the body that were not involved in surgery. Follow these instructions at home:  For at least 24 hours after the procedure:  Have a responsible adult stay with you. It is important to have someone help care for you until you are awake and alert.  Rest as needed.  Do not: ? Participate in activities in which you could fall or become injured. ? Drive. ? Use heavy machinery. ? Drink alcohol. ? Take sleeping pills or medicines that cause drowsiness. ? Make important decisions or sign legal documents. ? Take care of children on your own. Eating and drinking  Follow any instructions from your health care provider about eating or drinking restrictions.  When you feel hungry, start by eating small amounts of foods that are soft and easy to digest (bland), such as toast. Gradually return to your regular diet.  Drink enough fluid to keep your urine pale yellow.  If you vomit, rehydrate by drinking water, juice, or clear  broth. General instructions  If you have sleep apnea, surgery and certain medicines can increase your risk for breathing problems. Follow instructions from your health care provider about wearing your sleep device: ? Anytime you are sleeping, including during daytime naps. ? While taking prescription pain medicines, sleeping medicines, or medicines that make you drowsy.  Return to your normal activities as told by your health care provider. Ask your health care provider what activities are safe for you.  Take over-the-counter and prescription medicines only as told by your health care provider.  If you smoke, do not smoke without supervision.  Keep all follow-up visits as told by your health care provider. This is important. Contact a health care provider if:  You have nausea or vomiting that does not get better with medicine.  You cannot eat or drink without vomiting.  You have pain that does not get better with medicine.  You are unable to pass urine.  You develop a skin rash.  You have a fever.  You have redness around your IV site that gets worse. Get help right away if:  You have difficulty breathing.  You have chest pain.  You have blood in your urine  or stool, or you vomit blood. Summary  After the procedure, it is common to have a sore throat or nausea. It is also common to feel tired.  Have a responsible adult stay with you for the first 24 hours after general anesthesia. It is important to have someone help care for you until you are awake and alert.  When you feel hungry, start by eating small amounts of foods that are soft and easy to digest (bland), such as toast. Gradually return to your regular diet.  Drink enough fluid to keep your urine pale yellow.  Return to your normal activities as told by your health care provider. Ask your health care provider what activities are safe for you. This information is not intended to replace advice given to you by your  health care provider. Make sure you discuss any questions you have with your health care provider. Document Released: 03/11/2001 Document Revised: 07/19/2017 Document Reviewed: 07/19/2017 Elsevier Interactive Patient Education  2019 [employer].

## 2019-03-23 ENCOUNTER — Encounter (HOSPITAL_COMMUNITY): Payer: Self-pay | Admitting: Urology

## 2019-03-28 ENCOUNTER — Encounter (HOSPITAL_COMMUNITY): Payer: Self-pay | Admitting: Emergency Medicine

## 2019-03-28 ENCOUNTER — Emergency Department (HOSPITAL_COMMUNITY)
Admission: EM | Admit: 2019-03-28 | Discharge: 2019-03-28 | Disposition: A | Discharge status: Home / Self Care | Payer: 59 | Attending: Emergency Medicine | Admitting: Emergency Medicine

## 2019-03-28 ENCOUNTER — Other Ambulatory Visit: Payer: Self-pay

## 2019-03-28 DIAGNOSIS — M79604 Pain in right leg: Secondary | ICD-10-CM

## 2019-03-28 DIAGNOSIS — M79651 Pain in right thigh: Secondary | ICD-10-CM | POA: Diagnosis present

## 2019-03-28 DIAGNOSIS — F17200 Nicotine dependence, unspecified, uncomplicated: Secondary | ICD-10-CM | POA: Diagnosis not present

## 2019-03-28 LAB — CBC WITH DIFFERENTIAL/PLATELET
Abs Immature Granulocytes: 0.02 10*3/uL (ref 0.00–0.07)
Basophils Absolute: 0.1 10*3/uL (ref 0.0–0.1)
Basophils Relative: 1 %
Eosinophils Absolute: 0.1 10*3/uL (ref 0.0–0.5)
Eosinophils Relative: 1 %
HCT: 42 % (ref 39.0–52.0)
Hemoglobin: 14.3 g/dL (ref 13.0–17.0)
Immature Granulocytes: 0 %
Lymphocytes Relative: 16 %
Lymphs Abs: 1.5 10*3/uL (ref 0.7–4.0)
MCH: 28.5 pg (ref 26.0–34.0)
MCHC: 34 g/dL (ref 30.0–36.0)
MCV: 83.7 fL (ref 80.0–100.0)
Monocytes Absolute: 0.9 10*3/uL (ref 0.1–1.0)
Monocytes Relative: 9 %
Neutro Abs: 6.9 10*3/uL (ref 1.7–7.7)
Neutrophils Relative %: 73 %
Platelets: 167 10*3/uL (ref 150–400)
RBC: 5.02 MIL/uL (ref 4.22–5.81)
RDW: 12.9 % (ref 11.5–15.5)
WBC: 9.6 10*3/uL (ref 4.0–10.5)
nRBC: 0 % (ref 0.0–0.2)

## 2019-03-28 LAB — URINALYSIS, ROUTINE W REFLEX MICROSCOPIC
Bilirubin Urine: NEGATIVE
Glucose, UA: NEGATIVE mg/dL
Ketones, ur: NEGATIVE mg/dL
Leukocytes,Ua: NEGATIVE
Nitrite: NEGATIVE
Protein, ur: NEGATIVE mg/dL
Specific Gravity, Urine: 1.023 (ref 1.005–1.030)
pH: 6 (ref 5.0–8.0)

## 2019-03-28 LAB — BASIC METABOLIC PANEL
Anion gap: 9 (ref 5–15)
BUN: 17 mg/dL (ref 6–20)
CO2: 25 mmol/L (ref 22–32)
Calcium: 9.2 mg/dL (ref 8.9–10.3)
Chloride: 102 mmol/L (ref 98–111)
Creatinine, Ser: 0.87 mg/dL (ref 0.61–1.24)
GFR calc Af Amer: >60 mL/min (ref >60)
GFR calc non Af Amer: >60 mL/min (ref >60)
Glucose, Bld: 103 mg/dL — ABNORMAL HIGH (ref 70–99)
Potassium: 3.9 mmol/L (ref 3.5–5.1)
Sodium: 136 mmol/L (ref 135–145)

## 2019-03-28 MED ORDER — DIAZEPAM 5 MG PO TABS
5.0000 mg | ORAL_TABLET | Freq: Once | ORAL | Status: AC
  Administered 2019-03-28: 5 mg via ORAL
  Filled 2019-03-28: qty 1

## 2019-03-28 MED ORDER — ONDANSETRON HCL 4 MG PO TABS: 4.0000 mg | ORAL_TABLET | Freq: Three times a day (TID) | ORAL | 0 refills | Status: DC | PRN

## 2019-03-28 MED ORDER — OXYCODONE-ACETAMINOPHEN 5-325 MG PO TABS
1.0000 | ORAL_TABLET | Freq: Once | ORAL | Status: AC
  Administered 2019-03-28: 1 via ORAL
  Filled 2019-03-28: qty 1

## 2019-03-28 MED ORDER — ENOXAPARIN SODIUM 100 MG/ML ~~LOC~~ SOLN
90.0000 mg | Freq: Once | SUBCUTANEOUS | Status: AC
  Administered 2019-03-28: 90 mg via SUBCUTANEOUS
  Filled 2019-03-28: qty 1

## 2019-03-28 NOTE — ED Triage Notes (Addendum)
Pt stating he has had a recent surgery and now is right inner leg pain. No erythema and warmness noted to the area. Pt states this problem began 4 days ago.

## 2019-03-28 NOTE — ED Notes (Signed)
Pt states that he has updated his mother.

## 2019-03-28 NOTE — ED Notes (Signed)
Pt gave permission for staff to speak with his mother Esko Imparato and give update, mother is requesting to have pt tested for covid 19, Rn advised pt that the pt would be going home and therefore not tested for covid due to not having symptoms,

## 2019-03-28 NOTE — Discharge Instructions (Addendum)
You are scheduled for an ultrasound of your right leg to be done here tomorrow morning at 9:00 am, arrive at 8:45 to register.  You have been given a medication to thin your blood, so avoid falling.  Take your pain medication as directed.

## 2019-03-29 ENCOUNTER — Telehealth (HOSPITAL_COMMUNITY): Payer: Self-pay | Admitting: Emergency Medicine

## 2019-03-29 ENCOUNTER — Ambulatory Visit (HOSPITAL_COMMUNITY)
Admission: RE | Admit: 2019-03-29 | Discharge: 2019-03-29 | Disposition: A | Discharge status: Home / Self Care | Payer: 59 | Source: Ambulatory Visit | Attending: Urology | Admitting: Urology

## 2019-03-29 DIAGNOSIS — I82411 Acute embolism and thrombosis of right femoral vein: Secondary | ICD-10-CM | POA: Diagnosis not present

## 2019-03-29 DIAGNOSIS — I82431 Acute embolism and thrombosis of right popliteal vein: Secondary | ICD-10-CM | POA: Diagnosis not present

## 2019-03-29 DIAGNOSIS — M79604 Pain in right leg: Secondary | ICD-10-CM | POA: Insufficient documentation

## 2019-03-29 DIAGNOSIS — I82491 Acute embolism and thrombosis of other specified deep vein of right lower extremity: Secondary | ICD-10-CM | POA: Insufficient documentation

## 2019-03-29 MED ORDER — RIVAROXABAN (XARELTO) VTE STARTER PACK (15 & 20 MG): ORAL_TABLET | ORAL | 0 refills | Status: DC

## 2019-03-29 NOTE — ED Provider Notes (Signed)
[facility] EMERGENCY DEPARTMENT Provider Note   CSN: [id number] Arrival date & time: 03/28/19  1903    History   Chief Complaint Chief Complaint  Patient presents with  . Leg Pain    HPI Oscar Kaiser is a 33 y.o. male.     HPI   Oscar Kaiser is a 33 y.o. male who is s/p surgical repair of a fracture to his penis. Performed by Dr. Mena Goes on 03/20/19.   He presents to the Emergency Department complaining of pain of the medial right thigh.  Symptoms have been present for 4 days.  He describes a constant, throbbing pain of thigh that is worse with attempted weight bearing.  He also noticed some swelling of his medial lower leg.  He endorses little activity since his surgery.  He has a foley catheter in place since his surgery and reports some blood in his urine, but he was advised this may present secondary to surgery.  Urinating normally.  He denies redness of the affected extremity, numbness and decreased sensation to the extremities.  No fever, chills, abdominal or chest pain.  Also denies shortness of breath.    Past Medical History:  Diagnosis Date  . Back pain     There are no active problems to display for this patient.   Past Surgical History:  Procedure Laterality Date  . CYSTOSCOPY N/A 03/20/2019   Procedure: CYSTOSCOPY FLEXIBLE;  Surgeon: Jerilee Field, MD;  Location: [facility];  Service: Urology;  Laterality: N/A;  . REPAIR OF FRACTURED PENIS N/A 03/20/2019   Procedure: PENILE EXPLORATION AND REPAIR OF FRACTURED PENIS;  Surgeon: Jerilee Field, MD;  Location: [facility];  Service: Urology;  Laterality: N/A;        Home Medications    Prior to Admission medications   Medication Sig Start Date End Date Taking? Authorizing Provider  cephALEXin (KEFLEX) 500 MG capsule Take 1 capsule (500 mg total) by mouth 2 (two) times daily for 30 days. Take one capsule po BID for five days (until 03/24/2019) and then one capsule po QHS. 03/20/19 04/19/19 Yes Jerilee Field, MD  ibuprofen (ADVIL,MOTRIN) 200 MG tablet Take 200 mg by mouth every 6 (six) hours as needed.   Yes [provider]  lidocaine (LIDODERM) 5 % Place 1 patch onto the skin daily. Remove & Discard patch within 12 hours or as directed by MD   Yes [provider]  oxyCODONE-acetaminophen (PERCOCET) 5-325 MG tablet Take 1 tablet by mouth every 6 (six) hours as needed for severe pain. 03/20/19 03/19/20 Yes Jerilee Field, MD  ondansetron (ZOFRAN ODT) 4 MG disintegrating tablet Take 1 tablet (4 mg total) by mouth every 8 (eight) hours as needed for nausea or vomiting. Patient not taking: Reported on 03/20/2019 02/08/19   Samuel Jester, DO  ondansetron (ZOFRAN) 4 MG tablet Take 1 tablet (4 mg total) by mouth every 8 (eight) hours as needed for nausea or vomiting. 03/28/19   Katie Moch, PA-C  oseltamivir (TAMIFLU) 75 MG capsule Take 1 capsule (75 mg total) by mouth every 12 (twelve) hours. Patient not taking: Reported on 03/20/2019 02/08/19   Samuel Jester, DO  Rivaroxaban 15 & 20 MG TBPK Take as directed on package: Start with one 15mg  tablet by mouth twice a day with food. On Day 22, switch to one 20mg  tablet once a day with food. 03/29/19   Linwood Dibbles, MD    Family History No family history on file.  Social History Social History  Tobacco Use  . Smoking status: Current Some Day Smoker  . Smokeless tobacco: Never Used  Substance Use Topics  . Alcohol use: Never    Frequency: Never  . Drug use: Never     Allergies   Patient has no known allergies.   Review of Systems Review of Systems  Constitutional: Negative for chills and fever.  Respiratory: Negative for shortness of breath.   Cardiovascular: Positive for leg swelling. Negative for chest pain.  Gastrointestinal: Negative for abdominal pain, nausea and vomiting.  Genitourinary: Negative for difficulty urinating and dysuria.  Musculoskeletal: Positive for myalgias. Negative for joint swelling.        Pain to medial right thigh  Skin: Negative for color change and wound.  Neurological: Negative for weakness and numbness.     Physical Exam Updated Vital Signs BP 124/86   Pulse 77   Temp 98.6 F (37 C) (Oral)   Resp 17   SpO2 98%   Physical Exam Vitals signs and nursing note reviewed.  Constitutional:      General: He is not in acute distress.    Appearance: Normal appearance.  Cardiovascular:     Rate and Rhythm: Normal rate.     Pulses: Normal pulses.     Comments: DP and PT pulses are strong and palpable bilaterally Pulmonary:     Effort: Pulmonary effort is normal. No respiratory distress.     Breath sounds: Normal breath sounds.  Abdominal:     General: There is no distension.     Palpations: Abdomen is soft.     Tenderness: There is no abdominal tenderness.  Musculoskeletal:        General: Tenderness present.     Comments: Diffuse ttp of the medial aspect of right thigh and lower leg.  Mild edema noted of the lower leg.  No excessive warmth or erythema.  Distal sensation intact.    Skin:    General: Skin is warm.     Capillary Refill: Capillary refill takes less than 2 seconds.     Findings: No erythema.  Neurological:     General: No focal deficit present.     Mental Status: He is alert.     Sensory: No sensory deficit.  Psychiatric:        Mood and Affect: Mood normal.      ED Treatments / Results  Labs (all labs ordered are listed, but only abnormal results are displayed) Labs Reviewed  BASIC METABOLIC PANEL - Abnormal; Notable for the following components:      Result Value   Glucose, Bld 103 (*)    All other components within normal limits  URINALYSIS, ROUTINE W REFLEX MICROSCOPIC - Abnormal; Notable for the following components:   Hgb urine dipstick MODERATE (*)    Bacteria, UA RARE (*)    All other components within normal limits  CBC WITH DIFFERENTIAL/PLATELET    EKG None  Radiology   Procedures Procedures (including critical care  time)  Medications Ordered in ED Medications  diazepam (VALIUM) tablet 5 mg (5 mg Oral Given 03/28/19 2100)  oxyCODONE-acetaminophen (PERCOCET/ROXICET) 5-325 MG per tablet 1 tablet (1 tablet Oral Given 03/28/19 2100)  enoxaparin (LOVENOX) injection 90 mg (90 mg Subcutaneous Given 03/28/19 2106)     Initial Impression / Assessment and Plan / ED Course  I have reviewed the triage vital signs and the nursing notes.  Pertinent labs & imaging results that were available during my care of the patient were reviewed by me and  considered in my medical decision making (see chart for details).        Pt with LE pain after surgery.  Endorses limited activity.  NV intact.  Exam concerning for DVT.  Vitals reassuring and no chest pain or dyspnea on exam.  Since there is no US capability after hours, I will give Matheny Lovenox and I have scheduled pt to return here at 9:00 am Sunday morning for venous US.  Discussed with pt and he verbalized understanding and agrees to plan.    Final Clinical Impressions(s) / ED Diagnoses   Final diagnoses:  Right leg pain    ED Discharge Orders         Ordered    ondansetron (ZOFRAN) 4 MG tablet  Every 8 hours PRN     03/28/19 2137    US Venous Img Lower Unilateral Right     04 /11/20 2027           Pauline Ausriplett, Romain Erion, PA-C 03/29/19 1645    Donnetta Hutchingook, Brian, MD 03/29/19 2211

## 2019-03-29 NOTE — Telephone Encounter (Signed)
Addendum to prior telephone note.  Did discuss with the patient he will need to see a primary care doctor to refill his xarelto as he will need to be on this medication for months.

## 2019-03-29 NOTE — ED Notes (Signed)
Spoke with pharmacy regarding new Xarelto prescription and Lawson Fiscal (pharmacist) will call and speak to the patient.

## 2019-03-29 NOTE — Telephone Encounter (Signed)
Pt presented for follow up on his [place] study.  He was seen in the emergency room yesterday.  Please see that note.  Patient had a venous ultrasound of his right lower extremity.  The results do demonstrate an acute DVT.    IMPRESSION: Acute deep venous thrombosis throughout the right lower extremity as detailed, which is occlusive in the femoral and popliteal veins. Mobile deep venous thrombosis in right common femoral and profunda Veins.  Patient denies chest pain or shortness of breath.  He is complaining of pain in his right lower extremity.  Labs reviewed from yesterday.  No renal dysfunction.  I will start the patient on Xarelto.  We discussed the importance of taking this medication regularly.  I did discuss things to monitor for such as acute bleeding.  Pharmacy was consulted and they will also discuss with the patient the use of NOACs,

## 2019-03-29 NOTE — ED Notes (Signed)
Xarelto starter pack with voucher given and discussed with pt.  Answered all questions.  Rx called into pharmacy for Xarelto 15mg  bid x 3 weeks and then 20mg  po daily.  Pt to follow up with his primary care doctor within the next week.

## 2019-03-30 ENCOUNTER — Emergency Department (HOSPITAL_COMMUNITY)
Admission: EM | Admit: 2019-03-30 | Discharge: 2019-03-30 | Disposition: A | Discharge status: Home / Self Care | Payer: 59 | Source: Home / Self Care | Attending: Emergency Medicine | Admitting: Emergency Medicine

## 2019-03-30 ENCOUNTER — Telehealth: Payer: Self-pay | Admitting: Hematology & Oncology

## 2019-03-30 ENCOUNTER — Other Ambulatory Visit: Payer: Self-pay

## 2019-03-30 ENCOUNTER — Telehealth: Payer: Self-pay | Admitting: General Practice

## 2019-03-30 ENCOUNTER — Ambulatory Visit: Payer: Self-pay

## 2019-03-30 ENCOUNTER — Telehealth: Payer: Self-pay | Admitting: *Deleted

## 2019-03-30 ENCOUNTER — Encounter: Payer: Self-pay | Admitting: Hematology & Oncology

## 2019-03-30 ENCOUNTER — Encounter (HOSPITAL_COMMUNITY): Payer: Self-pay

## 2019-03-30 DIAGNOSIS — I824Y1 Acute embolism and thrombosis of unspecified deep veins of right proximal lower extremity: Secondary | ICD-10-CM | POA: Insufficient documentation

## 2019-03-30 DIAGNOSIS — I82431 Acute embolism and thrombosis of right popliteal vein: Secondary | ICD-10-CM | POA: Diagnosis not present

## 2019-03-30 DIAGNOSIS — F17200 Nicotine dependence, unspecified, uncomplicated: Secondary | ICD-10-CM

## 2019-03-30 DIAGNOSIS — M79604 Pain in right leg: Secondary | ICD-10-CM

## 2019-03-30 MED ORDER — OXYCODONE HCL 5 MG PO TABS
5.0000 mg | ORAL_TABLET | Freq: Once | ORAL | Status: AC
  Administered 2019-03-30: 5 mg via ORAL
  Filled 2019-03-30: qty 1

## 2019-03-30 MED ORDER — OXYCODONE HCL 5 MG PO TABS: 5.0000 mg | ORAL_TABLET | ORAL | 0 refills | Status: DC | PRN

## 2019-03-30 MED ORDER — METHOCARBAMOL 500 MG PO TABS: 500.0000 mg | ORAL_TABLET | Freq: Three times a day (TID) | ORAL | 0 refills | Status: DC | PRN

## 2019-03-30 MED ORDER — ONDANSETRON 4 MG PO TBDP
ORAL_TABLET | ORAL | Status: AC
  Administered 2019-03-30: 09:00:00
  Filled 2019-03-30: qty 1

## 2019-03-30 MED FILL — Ondansetron HCl Tab 4 MG: ORAL | Qty: 4 | Status: AC

## 2019-03-30 NOTE — ED Provider Notes (Signed)
[facility] Emergency Department Provider Note MRN:  [id number]  Arrival date & time: 03/30/19     Chief Complaint   Leg Pain   History of Present Illness   Oscar Kaiser is a 33 y.o. year-old male with a history of penile fractures status post surgical correction, complicated by DVT presenting to the ED with chief complaint of leg pain.  Patient explains he was diagnosed with a DVT yesterday, started on Xarelto.  Has only taken 2 doses, last night and this morning.  Explains that he woke up this morning feeling well, pain was well controlled when he was laying, but upon standing up he experienced "the worst pain of his life" in his right leg.  Pain seems much worse when attempting to stand up or obtain a vertical position.  Denies any new swelling or redness, no fever or chill, no chest pain or shortness of breath, no abdominal pain.  Has noted some blood in his Foley catheter bag intermittently since starting the blood thinner.  Review of Systems  A complete 10 system review of systems was obtained and all systems are negative except as noted in the HPI and PMH.   Patient's Health History    Past Medical History:  Diagnosis Date  . Back pain     Past Surgical History:  Procedure Laterality Date  . CYSTOSCOPY N/A 03/20/2019   Procedure: CYSTOSCOPY FLEXIBLE;  Surgeon: Jerilee Field, MD;  Location: [facility];  Service: Urology;  Laterality: N/A;  . REPAIR OF FRACTURED PENIS N/A 03/20/2019   Procedure: PENILE EXPLORATION AND REPAIR OF FRACTURED PENIS;  Surgeon: Jerilee Field, MD;  Location: [facility];  Service: Urology;  Laterality: N/A;    No family history on file.  Social History   Socioeconomic History  . Marital status: Single    Spouse name: Not on file  . Number of children: Not on file  . Years of education: Not on file  . Highest education level: Not on file  Occupational History  . Not on file  Social Needs  . Financial resource strain: Not on  file  . Food insecurity:    Worry: Not on file    Inability: Not on file  . Transportation needs:    Medical: Not on file    Non-medical: Not on file  Tobacco Use  . Smoking status: Current Some Day Smoker  . Smokeless tobacco: Never Used  Substance and Sexual Activity  . Alcohol use: Never    Frequency: Never  . Drug use: Never  . Sexual activity: Not on file  Lifestyle  . Physical activity:    Days per week: Not on file    Minutes per session: Not on file  . Stress: Not on file  Relationships  . Social connections:    Talks on phone: Not on file    Gets together: Not on file    Attends religious service: Not on file    Active member of club or organization: Not on file    Attends meetings of clubs or organizations: Not on file    Relationship status: Not on file  . Intimate partner violence:    Fear of current or ex partner: Not on file    Emotionally abused: Not on file    Physically abused: Not on file    Forced sexual activity: Not on file  Other Topics Concern  . Not on file  Social History Narrative  . Not on file  Physical Exam  Vital Signs and Nursing Notes reviewed Vitals:   03/30/19 0901  BP: (!) 130/118  Pulse: 95  Resp: 20  Temp: 98.5 F (36.9 C)  SpO2: 98%    CONSTITUTIONAL: Well-appearing, NAD NEURO:  Alert and oriented x 3, no focal deficits EYES:  eyes equal and reactive ENT/NECK:  no LAD, no JVD CARDIO: Regular rate, well-perfused, normal S1 and S2 PULM:  CTAB no wheezing or rhonchi GI/GU:  normal bowel sounds, non-distended, non-tender MSK/SPINE:  No gross deformities, no edema SKIN:  no rash, atraumatic PSYCH:  Appropriate speech and behavior  Diagnostic and Interventional Summary    Labs Reviewed - No data to display  No orders to display    Medications  oxyCODONE (Oxy IR/ROXICODONE) immediate release tablet 5 mg (5 mg Oral Given 03/30/19 0922)  ondansetron (ZOFRAN-ODT) 4 MG disintegrating tablet (  Given 03/30/19 [id number])      Procedures Critical Care  ED Course and Medical Decision Making  I have reviewed the triage vital signs and the nursing notes.  Pertinent labs & imaging results that were available during my care of the patient were reviewed by me and considered in my medical decision making (see below for details).  Legs over well-appearing, no erythema, no objective swelling, good strong and equal pulses distally, neurovascularly intact.  Vital signs are normal, no fever, normal range of motion of the lower extremities.  Favoring pain related to the DVT.  Nothing to suggest infection.  While propagation or worsening of the DVT is possible, not sure of the clinical importance or benefit of repeat ultrasound today given the well appearance of the legs and the fact the patient is already on anticoagulation.  Will attempt pain control and reassess.  Patient's pain is better controlled, continues to have a reassuring exam, continues to deny chest pain or shortness of breath, continues to have normal vital signs.  Patient was running out of his hydrocodone medication, which was prescribed for his postop pain with regard to his penile injury.  Will provide instructions and prescriptions for a pain control plan for his DVT.  After the discussed management above, the patient was determined to be safe for discharge.  The patient was in agreement with this plan and all questions regarding their care were answered.  ED return precautions were discussed and the patient will return to the ED with any significant worsening of condition.  Elmer SowMichael M. Pilar PlateBero, MD [facility] [email or web address]  Final Clinical Impressions(s) / ED Diagnoses     ICD-10-CM   1. Right leg pain M79.604   2. Acute deep vein thrombosis (DVT) of proximal vein of right lower extremity (HCC) I82.4Y1     ED Discharge Orders         Ordered    oxyCODONE (ROXICODONE) 5 MG immediate release tablet  Every 4  hours PRN     03/30/19 1004    methocarbamol (ROBAXIN) 500 MG tablet  Every 8 hours PRN     03/30/19 1004             Sabas SousBero, Aislynn Cifelli M, MD 03/30/19 1008

## 2019-03-30 NOTE — ED Triage Notes (Signed)
Pt was dx with blood clot in right leg yesterday. Pt with increase pain in right leg when he tried to stand this am. Pt started [employer]

## 2019-03-30 NOTE — Telephone Encounter (Signed)
lmom to inform pt of new pt appt 5/4 at 2 pm per 4/13 sch msg. Pt is self referral for newly diagnosed blood clot

## 2019-03-30 NOTE — Telephone Encounter (Signed)
Pt. Reports was diagnosed with DVT this weekend and started on Xarelto. Woke up this morning with more swelling and more redness. "I can hardly put weight on it." Instructed to go back ED for evaluation. Verbalizes understanding.  Reason for Disposition . Patient sounds very sick or weak to the triager  Answer Assessment - Initial Assessment Questions 1. ONSET: "When did the pain start?"      5 days ago 2. LOCATION: "Where is the pain located?"      Right leg 3. PAIN: "How bad is the pain?"    (Scale 1-10; or mild, moderate, severe)   -  MILD (1-3): doesn't interfere with normal activities    -  MODERATE (4-7): interferes with normal activities (e.g., work or school) or awakens from sleep, limping    -  SEVERE (8-10): excruciating pain, unable to do any normal activities, unable to walk     Severe 4. WORK OR EXERCISE: "Has there been any recent work or exercise that involved this part of the body?"      No - has DVT right leg 5. CAUSE: "What do you think is causing the leg pain?"     DVT 6. OTHER SYMPTOMS: "Do you have any other symptoms?" (e.g., chest pain, back pain, breathing difficulty, swelling, rash, fever, numbness, weakness)     Redness and swelling 7. PREGNANCY: "Is there any chance you are pregnant?" "When was your last menstrual period?"     N/a  Protocols used: LEG PAIN-A-AH

## 2019-03-30 NOTE — Telephone Encounter (Signed)
Called patient regarding appointments added for 5/6 per 4/13 sch msg

## 2019-03-30 NOTE — Telephone Encounter (Signed)
Pt.'s Mom called to ask if Dr. Clent Ridges could take him as apt. Called and spoke with Misty at the practice and states Dr. Clent Ridges will review and call her back.

## 2019-03-30 NOTE — Telephone Encounter (Signed)
Call received from patient's mother requesting a self-referral for patient to see Dr. Myna Hidalgo regarding newly diagnosed blood clot.  Dr. Myna Hidalgo notified.

## 2019-03-30 NOTE — Discharge Instructions (Addendum)
You were evaluated in the Emergency Department and after careful evaluation, we did not find any emergent condition requiring admission or further testing in the hospital.  Your symptoms today seem to be due to pain related to the blood clot of your right leg.  There could also be a component of muscle strain or spasm.  The medical term for this blood clot is a deep vein thrombosis, or DVT.  DVTs can be quite painful.  We recommend the following pain management plan:  Tylenol 1000 mg every 4-6 hours. You can use the Robaxin muscle relaxer every 8 hours as needed for breakthrough pain. You can also use the oxycodone pain medicine every 4 hours as needed for breakthrough pain.  Please return to the Emergency Department if you experience any worsening of your condition, such as chest pain or shortness of breath.  We encourage you to follow up with a primary care provider.  Thank you for allowing [place] to be a part of your care.

## 2019-03-31 ENCOUNTER — Encounter: Payer: Self-pay | Admitting: Family Medicine

## 2019-03-31 ENCOUNTER — Telehealth: Payer: Self-pay | Admitting: Hematology & Oncology

## 2019-03-31 ENCOUNTER — Ambulatory Visit (INDEPENDENT_AMBULATORY_CARE_PROVIDER_SITE_OTHER): Payer: 59 | Admitting: Family Medicine

## 2019-03-31 DIAGNOSIS — I82401 Acute embolism and thrombosis of unspecified deep veins of right lower extremity: Secondary | ICD-10-CM | POA: Insufficient documentation

## 2019-03-31 DIAGNOSIS — S39840D Fracture of corpus cavernosum penis, subsequent encounter: Secondary | ICD-10-CM | POA: Diagnosis not present

## 2019-03-31 DIAGNOSIS — I82411 Acute embolism and thrombosis of right femoral vein: Secondary | ICD-10-CM | POA: Diagnosis not present

## 2019-03-31 DIAGNOSIS — S39840A Fracture of corpus cavernosum penis, initial encounter: Secondary | ICD-10-CM | POA: Insufficient documentation

## 2019-03-31 MED ORDER — OXYCODONE HCL 5 MG PO TABS: 5.0000 mg | ORAL_TABLET | Freq: Four times a day (QID) | ORAL | 0 refills | Status: DC | PRN

## 2019-03-31 MED ORDER — ONDANSETRON HCL 4 MG PO TABS: 4.0000 mg | ORAL_TABLET | Freq: Three times a day (TID) | ORAL | 1 refills | Status: DC | PRN

## 2019-03-31 MED ORDER — RIVAROXABAN 20 MG PO TABS: 20.0000 mg | ORAL_TABLET | Freq: Every day | ORAL | 0 refills | Status: DC

## 2019-03-31 NOTE — Telephone Encounter (Signed)
sw pt to confirm new pt appt 4/15 at 315 pm per 4/14 staff msg

## 2019-03-31 NOTE — Progress Notes (Signed)
Subjective:    Patient ID: Oscar Kaiser, male    DOB: 1986/06/29, 33 y.o.   MRN: [id number]  HPI Virtual Visit via Video Note  I connected with the patient on 03/31/19 at  1:00 PM EDT by a video enabled telemedicine application and verified that I am speaking with the correct person using two identifiers.  Location patient: home Location provider:work or home office Persons participating in the virtual visit: patient, provider  I discussed the limitations of evaluation and management by telemedicine and the availability of in person appointments. The patient expressed understanding and agreed to proceed.   HPI: Here to establish with us for primary care and to follow up recent medical issues. The last time I saw him was in 2006. On 03-20-19 while having sex with his girlfriend he fractured the penis. He developed sudden pain, swelling, and bleeding from the penis and he could not void. He went to the ER, and he wound up having surgery to repair the penis per Dr. Jerilee FieldMatthew Eskridge. He has had a foley catheter in place since then and he has been urinating normally. Then about 3 days after the surgery he started to experience swelling and pain in the right thigh and calf with no hx of trauma. He went to the ER again on 03-29-19 and an US revealed a DVT with occlusion to the femoral and popliteal veins. He was started on 21 days of Xarelto 15 mg bid, and on 04-19-19 he will switch to 20 mg once a day. He has had a lot of pain in the leg ever since. After leaving the [employer], he has been working for [employer], but he has not been able to work since 03-20-19. He spends most of his time at home sitting on the couch with the leg propped up on pillows. He takes some Tylenol and he is using Oxycodone 5 mg as needed. His back is stiff from lying around. He denies any SOB or chest pain.    ROS: See pertinent positives and negatives per HPI.  Past Medical History:  Diagnosis Date  . Back pain     Past Surgical History:  Procedure Laterality Date  . CYSTOSCOPY N/A 03/20/2019   Procedure: CYSTOSCOPY FLEXIBLE;  Surgeon: Jerilee FieldEskridge, Matthew, MD;  Location: [facility];  Service: Urology;  Laterality: N/A;  . REPAIR OF FRACTURED PENIS N/A 03/20/2019   Procedure: PENILE EXPLORATION AND REPAIR OF FRACTURED PENIS;  Surgeon: Jerilee FieldEskridge, Matthew, MD;  Location: [facility];  Service: Urology;  Laterality: N/A;    History reviewed. No pertinent family history.   Current Outpatient Medications:  .  acetaminophen (TYLENOL) 500 MG tablet, Take 1,000 mg by mouth every 6 (six) hours as needed., Disp: , Rfl:  .  cephALEXin (KEFLEX) 500 MG capsule, Take 1 capsule (500 mg total) by mouth 2 (two) times daily for 30 days. Take one capsule po BID for five days (until 03/24/2019) and then one capsule po QHS., Disp: 40 capsule, Rfl: 0 .  methocarbamol (ROBAXIN) 500 MG tablet, Take 1 tablet (500 mg total) by mouth every 8 (eight) hours as needed for muscle spasms., Disp: 30 tablet, Rfl: 0 .  ondansetron (ZOFRAN) 4 MG tablet, Take 1 tablet (4 mg total) by mouth every 8 (eight) hours as needed for nausea or vomiting., Disp: 4 tablet, Rfl: 0 .  oxyCODONE (ROXICODONE) 5 MG immediate release tablet, Take 1 tablet (5 mg total) by mouth every 4 (four) hours as needed for severe pain., Disp:  15 tablet, Rfl: 0 .  oxyCODONE-acetaminophen (PERCOCET) 5-325 MG tablet, Take 1 tablet by mouth every 6 (six) hours as needed for severe pain., Disp: 15 tablet, Rfl: 0 .  Rivaroxaban 15 & 20 MG TBPK, Take as directed on package: Start with one 15mg  tablet by mouth twice a day with food. On Day 22, switch to one 20mg  tablet once a day with food., Disp: 51 each, Rfl: 0  EXAM:  VITALS per patient if applicable:  GENERAL: alert, oriented, appears well and in no acute distress  HEENT: atraumatic, conjunttiva clear, no obvious abnormalities on inspection of external nose and ears  NECK: normal movements of the head and neck  LUNGS: on inspection  no signs of respiratory distress, breathing rate appears normal, no obvious gross SOB, gasping or wheezing  CV: no obvious cyanosis  MS: moves all visible extremities without noticeable abnormality  PSYCH/NEURO: pleasant and cooperative, no obvious depression or anxiety, speech and thought processing grossly intact  ASSESSMENT AND PLAN: He is recovering from surgery to repair a fractured penis, and he will follow up with Dr. Mena Goes on 04-06-19 to see of the foley can be removed. He is being anticoagulated for a DVT in the right leg, and he will see Dr. Arlan Organ of Hematology for this tomorrow. He is staying off his feet for now and elevating the right leg. He can use warm compresses for pain relief. We will supply him with a few more Oxycodone tablets to use prn. We plan to have a follow up visit with him in 2 weeks to discuss return to work plans, etc.  Gershon Crane, MD  Discussed the following assessment and plan:  No diagnosis found.     I discussed the assessment and treatment plan with the patient. The patient was provided an opportunity to ask questions and all were answered. The patient agreed with the plan and demonstrated an understanding of the instructions.   The patient was advised to call back or seek an in-person evaluation if the symptoms worsen or if the condition fails to improve as anticipated.     Review of Systems     Objective:   Physical Exam        Assessment & Plan:

## 2019-04-01 ENCOUNTER — Emergency Department (HOSPITAL_COMMUNITY): Admission: EM | Admit: 2019-04-01 | Discharge: 2019-04-01 | Discharge status: Absent without leave | Payer: 59

## 2019-04-01 ENCOUNTER — Encounter: Payer: Self-pay | Admitting: Hematology & Oncology

## 2019-04-01 ENCOUNTER — Inpatient Hospital Stay (HOSPITAL_COMMUNITY)
Admission: EM | Admit: 2019-04-01 | Discharge: 2019-04-05 | DRG: 271 | Disposition: A | Discharge status: Home / Self Care | Payer: 59 | Source: Ambulatory Visit | Attending: Surgery | Admitting: Surgery

## 2019-04-01 ENCOUNTER — Inpatient Hospital Stay: Payer: 59 | Attending: Hematology & Oncology

## 2019-04-01 ENCOUNTER — Inpatient Hospital Stay (HOSPITAL_BASED_OUTPATIENT_CLINIC_OR_DEPARTMENT_OTHER): Payer: 59 | Admitting: Hematology & Oncology

## 2019-04-01 ENCOUNTER — Encounter (HOSPITAL_COMMUNITY): Payer: Self-pay

## 2019-04-01 ENCOUNTER — Other Ambulatory Visit: Payer: Self-pay

## 2019-04-01 VITALS — BP 128/95 | HR 119 | Temp 98.7°F | Resp 20 | Wt 190.0 lb

## 2019-04-01 DIAGNOSIS — I82411 Acute embolism and thrombosis of right femoral vein: Secondary | ICD-10-CM

## 2019-04-01 DIAGNOSIS — R0789 Other chest pain: Secondary | ICD-10-CM

## 2019-04-01 DIAGNOSIS — Z79899 Other long term (current) drug therapy: Secondary | ICD-10-CM

## 2019-04-01 DIAGNOSIS — I82412 Acute embolism and thrombosis of left femoral vein: Secondary | ICD-10-CM | POA: Diagnosis not present

## 2019-04-01 DIAGNOSIS — S39840S Fracture of corpus cavernosum penis, sequela: Secondary | ICD-10-CM | POA: Diagnosis not present

## 2019-04-01 DIAGNOSIS — Z9889 Other specified postprocedural states: Secondary | ICD-10-CM

## 2019-04-01 DIAGNOSIS — M549 Dorsalgia, unspecified: Secondary | ICD-10-CM

## 2019-04-01 DIAGNOSIS — Z7901 Long term (current) use of anticoagulants: Secondary | ICD-10-CM | POA: Diagnosis not present

## 2019-04-01 DIAGNOSIS — D6862 Lupus anticoagulant syndrome: Secondary | ICD-10-CM | POA: Diagnosis present

## 2019-04-01 DIAGNOSIS — I82421 Acute embolism and thrombosis of right iliac vein: Secondary | ICD-10-CM | POA: Diagnosis not present

## 2019-04-01 DIAGNOSIS — S39840D Fracture of corpus cavernosum penis, subsequent encounter: Secondary | ICD-10-CM

## 2019-04-01 DIAGNOSIS — M791 Myalgia, unspecified site: Secondary | ICD-10-CM

## 2019-04-01 DIAGNOSIS — Z960 Presence of urogenital implants: Secondary | ICD-10-CM | POA: Diagnosis present

## 2019-04-01 DIAGNOSIS — X58XXXD Exposure to other specified factors, subsequent encounter: Secondary | ICD-10-CM | POA: Diagnosis present

## 2019-04-01 DIAGNOSIS — I82431 Acute embolism and thrombosis of right popliteal vein: Principal | ICD-10-CM | POA: Diagnosis present

## 2019-04-01 DIAGNOSIS — S3733XD Laceration of urethra, subsequent encounter: Secondary | ICD-10-CM

## 2019-04-01 LAB — CMP (CANCER CENTER ONLY)
ALT: 22 U/L (ref 0–44)
AST: 18 U/L (ref 15–41)
Albumin: 4.5 g/dL (ref 3.5–5.0)
Alkaline Phosphatase: 103 U/L (ref 38–126)
Anion gap: 10 (ref 5–15)
BUN: 14 mg/dL (ref 6–20)
CO2: 29 mmol/L (ref 22–32)
Calcium: 10.3 mg/dL (ref 8.9–10.3)
Chloride: 96 mmol/L — ABNORMAL LOW (ref 98–111)
Creatinine: 0.87 mg/dL (ref 0.61–1.24)
GFR, Est AFR Am: >60 mL/min (ref >60)
GFR, Estimated: >60 mL/min (ref >60)
Glucose, Bld: 145 mg/dL — ABNORMAL HIGH (ref 70–99)
Potassium: 3.5 mmol/L (ref 3.5–5.1)
Sodium: 135 mmol/L (ref 135–145)
Total Bilirubin: 2 mg/dL — ABNORMAL HIGH (ref 0.3–1.2)
Total Protein: 8.2 g/dL — ABNORMAL HIGH (ref 6.5–8.1)

## 2019-04-01 LAB — D-DIMER, QUANTITATIVE (NOT AT ARMC): D-Dimer, Quant: 3.49 ug/mL-FEU — ABNORMAL HIGH (ref 0.00–0.50)

## 2019-04-01 LAB — CBC WITH DIFFERENTIAL (CANCER CENTER ONLY)
Abs Immature Granulocytes: 0.04 10*3/uL (ref 0.00–0.07)
Basophils Absolute: 0.1 10*3/uL (ref 0.0–0.1)
Basophils Relative: 1 %
Eosinophils Absolute: 0.1 10*3/uL (ref 0.0–0.5)
Eosinophils Relative: 1 %
HCT: 40.7 % (ref 39.0–52.0)
Hemoglobin: 14.1 g/dL (ref 13.0–17.0)
Immature Granulocytes: 0 %
Lymphocytes Relative: 15 %
Lymphs Abs: 1.6 10*3/uL (ref 0.7–4.0)
MCH: 28.6 pg (ref 26.0–34.0)
MCHC: 34.6 g/dL (ref 30.0–36.0)
MCV: 82.6 fL (ref 80.0–100.0)
Monocytes Absolute: 0.6 10*3/uL (ref 0.1–1.0)
Monocytes Relative: 6 %
Neutro Abs: 7.7 10*3/uL (ref 1.7–7.7)
Neutrophils Relative %: 77 %
Platelet Count: 263 10*3/uL (ref 150–400)
RBC: 4.93 MIL/uL (ref 4.22–5.81)
RDW: 12.5 % (ref 11.5–15.5)
WBC Count: 10.1 10*3/uL (ref 4.0–10.5)
nRBC: 0 % (ref 0.0–0.2)

## 2019-04-01 LAB — ANTITHROMBIN III: AntiThromb III Func: 118 % (ref 75–120)

## 2019-04-01 LAB — TYPE AND SCREEN
ABO/RH(D): A POS
Antibody Screen: NEGATIVE

## 2019-04-01 LAB — SAVE SMEAR(SSMR), FOR PROVIDER SLIDE REVIEW

## 2019-04-01 MED ORDER — ACETAMINOPHEN 325 MG RE SUPP: 325.0000 mg | RECTAL | Status: DC | PRN

## 2019-04-01 MED ORDER — HYDRALAZINE HCL 20 MG/ML IJ SOLN: 5.0000 mg | INTRAMUSCULAR | Status: DC | PRN

## 2019-04-01 MED ORDER — PHENOL 1.4 % MT LIQD: 1.0000 | OROMUCOSAL | Status: DC | PRN

## 2019-04-01 MED ORDER — ONDANSETRON HCL 4 MG/2ML IJ SOLN
4.0000 mg | Freq: Four times a day (QID) | INTRAMUSCULAR | Status: DC | PRN
  Administered 2019-04-01: 4 mg via INTRAVENOUS

## 2019-04-01 MED ORDER — PANTOPRAZOLE SODIUM 40 MG PO TBEC
40.0000 mg | DELAYED_RELEASE_TABLET | Freq: Every day | ORAL | Status: DC
  Administered 2019-04-04: 40 mg via ORAL
  Filled 2019-04-01 (×2): qty 1

## 2019-04-01 MED ORDER — METOPROLOL TARTRATE 5 MG/5ML IV SOLN: 2.0000 mg | INTRAVENOUS | Status: DC | PRN

## 2019-04-01 MED ORDER — LABETALOL HCL 5 MG/ML IV SOLN: 10.0000 mg | INTRAVENOUS | Status: DC | PRN

## 2019-04-01 MED ORDER — METHOCARBAMOL 500 MG PO TABS: 500.0000 mg | ORAL_TABLET | Freq: Three times a day (TID) | ORAL | Status: DC | PRN

## 2019-04-01 MED ORDER — POTASSIUM CHLORIDE CRYS ER 20 MEQ PO TBCR
20.0000 meq | EXTENDED_RELEASE_TABLET | Freq: Once | ORAL | Status: AC
  Administered 2019-04-01: 20 meq via ORAL
  Filled 2019-04-01: qty 1

## 2019-04-01 MED ORDER — HEPARIN (PORCINE) 25000 UT/250ML-% IV SOLN
1700.0000 [IU]/h | INTRAVENOUS | Status: DC
  Administered 2019-04-01: 1350 [IU]/h via INTRAVENOUS
  Administered 2019-04-02: 1500 [IU]/h via INTRAVENOUS
  Administered 2019-04-03: 1700 [IU]/h via INTRAVENOUS
  Filled 2019-04-01 (×3): qty 250

## 2019-04-01 MED ORDER — ALUM & MAG HYDROXIDE-SIMETH 200-200-20 MG/5ML PO SUSP: 15.0000 mL | ORAL | Status: DC | PRN

## 2019-04-01 MED ORDER — GUAIFENESIN-DM 100-10 MG/5ML PO SYRP: 15.0000 mL | ORAL_SOLUTION | ORAL | Status: DC | PRN

## 2019-04-01 MED ORDER — ONDANSETRON HCL 4 MG PO TABS: 4.0000 mg | ORAL_TABLET | Freq: Three times a day (TID) | ORAL | Status: DC | PRN

## 2019-04-01 MED ORDER — ACETAMINOPHEN 325 MG PO TABS: 325.0000 mg | ORAL_TABLET | ORAL | Status: DC | PRN

## 2019-04-01 MED ORDER — OXYCODONE-ACETAMINOPHEN 5-325 MG PO TABS
1.0000 | ORAL_TABLET | ORAL | Status: DC | PRN
  Administered 2019-04-01 – 2019-04-03 (×7): 1 via ORAL
  Administered 2019-04-04: 2 via ORAL
  Administered 2019-04-04 (×2): 1 via ORAL
  Filled 2019-04-01 (×2): qty 1
  Filled 2019-04-01: qty 2
  Filled 2019-04-01: qty 1
  Filled 2019-04-01: qty 2
  Filled 2019-04-01: qty 1
  Filled 2019-04-01: qty 2
  Filled 2019-04-01 (×2): qty 1
  Filled 2019-04-01: qty 2
  Filled 2019-04-01 (×2): qty 1

## 2019-04-01 NOTE — ED Triage Notes (Signed)
Pt sent here from PCP for admission and IV heparin for a blood clot in his Right upper leg. Pt was told by PCP that he was putting in direct admission orders but no orders were put in. Per PCP note the redness and swelling and gotten worse. Pt has foley in place.

## 2019-04-01 NOTE — Progress Notes (Signed)
Referral MD  Reason for Referral: Acute thrombus in the right leg-possibly refractory to Xarelto; recent penile fracture with surgical repair  Chief Complaint  Patient presents with  . New Patient (Initial Visit)  : My right leg is incredibly painful.  I cannot walk on it now.  HPI: [name] is a really nice 33 year old white male.  He is a former [place] [employer] veteran.  He was in [place].  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  He currently works for [employer].  He lives in [place].  He sustained a traumatic fracture of the penis a little less than 2 weeks ago.  He underwent surgery for this.  It was a fairly extensive procedure.  He has a Foley catheter in.  He has been somewhat immobile.  He then developed pain and swelling in the right leg.  He went to the ER at [facility].  On 03/29/2019, he underwent a Doppler of his right leg.  This showed an extensive thrombus in the femoral vein down through the popliteal vein.  He was placed on Xarelto.  He is on Xarelto 15 mg p.o. twice daily.  Over the past couple days, the right leg has gotten worse.  He can lie down but when he tries to stand the pain is intense in the right leg and he cannot walk.  He has no past history of any type of blood clots.  His mother had blood clots.  He does not smoke.  He does not drink.  He does not do testosterone or other supplements.  He really has had no obvious risk factor for thromboembolic disease.  Given that he is so young, I fear that he would develop postphlebitic syndrome of the right leg and this would significantly compromise his quality of life for a very long time.  As such, I believe that he needs to be admitted for IV heparin and for some type of thrombolytic therapy or possibly even thrombectomy.  He says he is having some chest discomfort.  There is no obvious shortness of breath.  He has had no cough.  He has really been taking no medications.  He is on pain medication right  now for the penile fracture and for the thromboembolic disease in his right leg.  Currently, his performance status is ECOG 1.   Past Medical History:  Diagnosis Date  . Back pain   :  Past Surgical History:  Procedure Laterality Date  . CYSTOSCOPY N/A 03/20/2019   Procedure: CYSTOSCOPY FLEXIBLE;  Surgeon: Jerilee Field, MD;  Location: [facility];  Service: Urology;  Laterality: N/A;  . REPAIR OF FRACTURED PENIS N/A 03/20/2019   Procedure: PENILE EXPLORATION AND REPAIR OF FRACTURED PENIS;  Surgeon: Jerilee Field, MD;  Location: [facility];  Service: Urology;  Laterality: N/A;  :   Current Outpatient Medications:  .  acetaminophen (TYLENOL) 500 MG tablet, Take 1,000 mg by mouth every 6 (six) hours as needed., Disp: , Rfl:  .  cephALEXin (KEFLEX) 500 MG capsule, Take 1 capsule (500 mg total) by mouth 2 (two) times daily for 30 days. Take one capsule po BID for five days (until 03/24/2019) and then one capsule po QHS., Disp: 40 capsule, Rfl: 0 .  methocarbamol (ROBAXIN) 500 MG tablet, Take 1 tablet (500 mg total) by mouth every 8 (eight) hours as needed for muscle spasms., Disp: 30 tablet, Rfl: 0 .  ondansetron (ZOFRAN) 4 MG tablet, Take 1 tablet (4 mg total) by mouth every  8 (eight) hours as needed for nausea or vomiting., Disp: 30 tablet, Rfl: 1 .  oxyCODONE (ROXICODONE) 5 MG immediate release tablet, Take 1 tablet (5 mg total) by mouth every 6 (six) hours as needed for severe pain., Disp: 30 tablet, Rfl: 0 .  Rivaroxaban 15 & 20 MG TBPK, Take as directed on package: Start with one  tablet by mouth twice a day with food. On Day 22, switch to one  tablet once a day with food., Disp: 51 each, Rfl: 0 .  oxyCODONE-acetaminophen (PERCOCET) 5-325 MG tablet, Take 1 tablet by mouth every 6 (six) hours as needed for severe pain. (Patient not taking: Reported on 04/01/2019), Disp: 15 tablet, Rfl: 0 .  rivaroxaban (XARELTO) 20 MG TABS tablet, Take 1 tablet (20 mg total) by mouth daily with supper.  (Patient not taking: Reported on 04/01/2019), Disp: 90 tablet, Rfl: 0:  :  No Known Allergies:  History reviewed. No pertinent family history.:  Social History   Socioeconomic History  . Marital status: Single    Spouse name: Not on file  . Number of children: Not on file  . Years of education: Not on file  . Highest education level: Not on file  Occupational History  . Not on file  Social Needs  . Financial resource strain: Not on file  . Food insecurity:    Worry: Not on file    Inability: Not on file  . Transportation needs:    Medical: Not on file    Non-medical: Not on file  Tobacco Use  . Smoking status: Never Smoker  . Smokeless tobacco: Never Used  Substance and Sexual Activity  . Alcohol use: Never    Frequency: Never  . Drug use: Not Currently    Types: Marijuana  . Sexual activity: Not Currently  Lifestyle  . Physical activity:    Days per week: Not on file    Minutes per session: Not on file  . Stress: Not on file  Relationships  . Social connections:    Talks on phone: Not on file    Gets together: Not on file    Attends religious service: Not on file    Active member of club or organization: Not on file    Attends meetings of clubs or organizations: Not on file    Relationship status: Not on file  . Intimate partner violence:    Fear of current or ex partner: Not on file    Emotionally abused: Not on file    Physically abused: Not on file    Forced sexual activity: Not on file  Other Topics Concern  . Not on file  Social History Narrative  . Not on file  :  Review of Systems  Constitutional: Negative.   HENT: Negative.   Eyes: Negative.   Respiratory: Negative.   Cardiovascular: Negative.   Gastrointestinal: Negative.   Genitourinary: Negative.   Musculoskeletal: Positive for myalgias.  Skin: Negative.   Neurological: Negative.   Endo/Heme/Allergies: Negative.   Psychiatric/Behavioral: Negative.      Exam: This is a  well-developed and well-nourished white male in no obvious distress.  Vital signs show temperature of 98.7.  Pulse 119.  Blood pressure 128/95.  Weight is 190 pounds.  Head neck exam shows no ocular or oral lesions.  He has no adenopathy in the neck.  He has no scleral icterus.  Thyroid is nonpalpable.  Lungs are clear bilaterally.  I hear no friction rubs.  I hear no wheezes.  Cardiac exam is slightly tachycardic but regular.  He has no murmurs, rubs or bruits.  Abdomen is soft.  Has good bowel sounds.  He has no fluid wave.  There is no palpable liver or spleen tip.  Extremities does show swelling in the right leg.  He has tenderness all throughout his right thigh.  He has a positive Homans sign with the right leg.  He has good pulses in his distal extremities.  Left leg is unremarkable.  Skin exam shows no rashes, ecchymoses or petechia.  Neurological exam shows no focal neurological deficits. @IPVITALS @   Recent Labs    04/01/19 1513  WBC 10.1  HGB 14.1  HCT 40.7  PLT 263   Recent Labs    04/01/19 1513  NA 135  K 3.5  CL 96*  CO2 29  GLUCOSE 145*  BUN 14  CREATININE 0.87  CALCIUM 10.3    Blood smear review: None  Pathology: None    Assessment and Plan: [name] is a very nice 33 year old white male.  He has an extensive thrombus in his right leg.  This is in the femoral vein.  I worry that this is moving up into the iliac vessels.  His d-dimer is 3.5.  I think this might be indicative of continued clotting activity.  I believe that he is a candidate for some type of thrombolytic treatment.  I realize that he had surgery 2 weeks ago.  This might be too soon for any type of thrombolytic therapy to be utilized.  However, I wonder if some type of thrombectomy could be done.  I worry that he is so young and I would hate to see him be crippled by postphlebitic syndrome and not be able to enjoy a life with hopefully a new wife and children.  Outside of the penile surgery, I do  not see any other reason why he could not have some type of invasive procedure for his right leg.  Again, I just want to try to be as aggressive as possible.  He will definitely need IV heparin.  This might be able to help with the blood clot.  I know that we do have failures of Xarelto.  They are unusual.  It is possible that he may have some anatomic issue that could have led to the blood clot.  I am sure that when he gets to the hospital that he probably would have an angiogram to look at the anatomy.  I will certainly follow-up with him when he is in the hospital.  I spent over an hour with him today.  This was incredibly complicated.  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

## 2019-04-01 NOTE — ED Provider Notes (Signed)
[facility] Provider Note   CSN: [id number] Arrival date & time: 04/01/19  1947    History   Chief Complaint Chief Complaint  Patient presents with  . Leg Pain    HPI Oscar Kaiser is a 33 y.o. male who was sent in to the emergency department by Dr. Myna HidalgoEnnever of hem/onc for DVT of the right leg.  Unfortunately sustained a traumatic fracture of his penis 2 weeks ago and underwent repair.  He developed swelling and pain in his right leg and was seen in any [facility] on 411.  He underwent a DVT ultrasound on the 12th which showed an extensive thrombus in the femoral vein down through the popliteal vein with some involvement of the common femoral vein.  Patient has been on Xarelto for the past 2 days.  He complains of worsening severe pain in the right leg and that pain is much more worse whenever he tries to bear weight on the leg.  Dr. Myna HidalgoEnnever was concerned that he may be at risk for post thrombotic syndrome due to the extensive nature of his clot and felt he might need admission for heparin for potential thrombolytic therapy versus thrombectomy.  The patient denies chest pain or shortness of breath     HPI  Past Medical History:  Diagnosis Date  . Back pain     Patient Active Problem List   Diagnosis Date Noted  . 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 Right leg DVT (HCC) 03/31/2019    Past Surgical History:  Procedure Laterality Date  . CYSTOSCOPY N/A 03/20/2019   Procedure: CYSTOSCOPY FLEXIBLE;  Surgeon: Jerilee FieldEskridge, Matthew, MD;  Location: [facility];  Service: Urology;  Laterality: N/A;  . REPAIR OF FRACTURED PENIS N/A 03/20/2019   Procedure: PENILE EXPLORATION AND REPAIR OF FRACTURED PENIS;  Surgeon: Jerilee FieldEskridge, Matthew, MD;  Location: [facility];  Service: Urology;  Laterality: N/A;        Home Medications    Prior to Admission medications   Medication Sig Start Date End  Date Taking? Authorizing Provider  acetaminophen (TYLENOL) 500 MG tablet Take 1,000 mg by mouth every 6 (six) hours as needed for mild pain.    Yes [provider]  cephALEXin (KEFLEX) 500 MG capsule Take 1 capsule (500 mg total) by mouth 2 (two) times daily for 30 days. Take one capsule po BID for five days (until 03/24/2019) and then one capsule po QHS. Patient taking differently: Take 500 mg by mouth at bedtime. Take one capsule po BID for five days (until 03/24/2019) and then one capsule po QHS. 03/20/19 04/19/19 Yes Jerilee FieldEskridge, Matthew, MD  docusate sodium (COLACE) 100 MG capsule Take 100 mg by mouth 2 (two) times daily as needed for mild constipation.   Yes [provider]  methocarbamol (ROBAXIN) 500 MG tablet Take 1 tablet (500 mg total) by mouth every 8 (eight) hours as needed for muscle spasms. 03/30/19  Yes Sabas SousBero, Michael M, MD  oxyCODONE (ROXICODONE) 5 MG immediate release tablet Take 1 tablet (5 mg total) by mouth every 6 (six) hours as needed for severe pain. 03/31/19  Yes Nelwyn SalisburyFry, Stephen A, MD  Rivaroxaban 15 & 20 MG TBPK Take as directed on package: Start with one 15mg  tablet by mouth twice a day with food. On Day 22, switch to one 20mg  tablet once a day with food. 03/29/19  Yes Linwood DibblesKnapp, Jon, MD  ondansetron [facility](ZOFRAN) 4  MG tablet Take 1 tablet (4 mg total) by mouth every 8 (eight) hours as needed for nausea or vomiting. Patient not taking: Reported on 04/01/2019 03/31/19   Nelwyn Salisbury, MD  oxyCODONE-acetaminophen (PERCOCET) 5-325 MG tablet Take 1 tablet by mouth every 6 (six) hours as needed for severe pain. Patient not taking: Reported on 04/01/2019 03/20/19 03/19/20  Jerilee Field, MD  rivaroxaban (XARELTO) 20 MG TABS tablet Take 1 tablet (20 mg total) by mouth daily with supper. Patient not taking: Reported on 04/01/2019 03/31/19   Nelwyn Salisbury, MD    Family History No family history on file.  Social History Social History   Tobacco Use  . Smoking status: Never Smoker  .  Smokeless tobacco: Never Used  Substance Use Topics  . Alcohol use: Never    Frequency: Never  . Drug use: Not Currently    Types: Marijuana     Allergies   Patient has no known allergies.   Review of Systems Review of Systems   Ten systems reviewed and are negative for acute change, except as noted in the HPI.    Physical Exam Updated Vital Signs BP 131/89   Pulse 84   Temp 98.5 F (36.9 C) (Oral)   Resp 14   SpO2 99%   Physical Exam Vitals signs and nursing note reviewed.  Constitutional:      General: He is not in acute distress.    Appearance: He is well-developed. He is not diaphoretic.  HENT:     Head: Normocephalic and atraumatic.  Eyes:     General: No scleral icterus.    Conjunctiva/sclera: Conjunctivae normal.  Neck:     Musculoskeletal: Normal range of motion and neck supple.  Cardiovascular:     Rate and Rhythm: Normal rate and regular rhythm.     Heart sounds: Normal heart sounds.  Pulmonary:     Effort: Pulmonary effort is normal. No respiratory distress.     Breath sounds: Normal breath sounds.  Abdominal:     Palpations: Abdomen is soft.     Tenderness: There is no abdominal tenderness.  Genitourinary:    Comments: Foley catheter in place Musculoskeletal:     Right lower leg: Edema present.     Comments: RLE swelling No venous distension or distension  Skin:    General: Skin is warm and dry.  Neurological:     Mental Status: He is alert.  Psychiatric:        Behavior: Behavior normal.      ED Treatments / Results  Labs (all labs ordered are listed, but only abnormal results are displayed) Labs Reviewed  HIV ANTIBODY (ROUTINE TESTING W REFLEX)  CBC  BASIC METABOLIC PANEL  HEPARIN LEVEL (UNFRACTIONATED)  APTT  CBC  TYPE AND SCREEN    EKG None  Radiology No results found.  Procedures Procedures (including critical care time)  Medications Ordered in ED Medications  potassium chloride SA (K-DUR,KLOR-CON) CR tablet  20-40 mEq (has no administration in time range)  ondansetron (ZOFRAN) injection 4 mg (has no administration in time range)  alum & mag hydroxide-simeth (MAALOX/MYLANTA) 200-200-20 MG/5ML suspension 15-30 mL (has no administration in time range)  pantoprazole (PROTONIX) EC tablet 40 mg (has no administration in time range)  labetalol (NORMODYNE,TRANDATE) injection 10 mg (has no administration in time range)  hydrALAZINE (APRESOLINE) injection 5 mg (has no administration in time range)  metoprolol tartrate (LOPRESSOR) injection 2-5 mg (has no administration in time range)  guaiFENesin-dextromethorphan (ROBITUSSIN DM) 100-10 MG/5ML  syrup 15 mL (has no administration in time range)  phenol (CHLORASEPTIC) mouth spray 1 spray (has no administration in time range)  acetaminophen (TYLENOL) tablet 325-650 mg (has no administration in time range)    Or  acetaminophen (TYLENOL) suppository 325-650 mg (has no administration in time range)  oxyCODONE-acetaminophen (PERCOCET/ROXICET) 5-325 MG per tablet 1-2 tablet (has no administration in time range)  methocarbamol (ROBAXIN) tablet 500 mg (has no administration in time range)  ondansetron (ZOFRAN) tablet 4 mg (has no administration in time range)  heparin ADULT infusion 100 units/mL (25000 units/254mL sodium chloride 0.45%) (has no administration in time range)     Initial Impression / Assessment and Plan / ED Course  I have reviewed the triage vital signs and the nursing notes.  Pertinent labs & imaging results that were available during my care of the patient were reviewed by me and considered in my medical decision making (see chart for details).        Patient here with DVT. I spoke with Dr.Brabham who will admit the patient . No evidence of Phlegmasia.  Patient appears stable for admission.  Final Clinical Impressions(s) / ED Diagnoses   Final diagnoses:  None    ED Discharge Orders    None       Arthor Captain, PA-C 04/01/19 2259     Lorre Nick, MD 04/02/19 2248

## 2019-04-01 NOTE — ED Notes (Addendum)
Pt declining any blood draws at this moment, states he had several labs done at his PCP today. PA Harris aware.

## 2019-04-01 NOTE — H&P (Signed)
[facility] [place]  Patient name: Oscar Kaiser MRN: [id number] DOB: 1986/08/02 Sex: male   REQUESTING PROVIDER:    ER   REASON FOR CONSULT:    Right leg DVT  HISTORY OF PRESENT ILLNESS:   Oscar Kaiser is a 33 y.o. male, who is s/p repair of penile fracture on 03-20-2019.  He has been somewhat immobile since his surgery.  On 4-12 a duplex revealed femoral popliteal DVT.  HE was started on Xarelto, but his pain has gotten worse.  It is more severe when he stands up and walks.  He feels better with compressions stockings.  He denies any history of prior DVT.  There is no family history of DVT.  He was seen by Dr. Myna Hidalgo who sent him to the ER for possible lysis or thrombectomy.  His D-dimer is elevated at 3.5.  PAST MEDICAL HISTORY    Past Medical History:  Diagnosis Date  . Back pain      FAMILY HISTORY   No family history on file.  SOCIAL HISTORY:   Social History   Socioeconomic History  . Marital status: Single    Spouse name: Not on file  . Number of children: Not on file  . Years of education: Not on file  . Highest education level: Not on file  Occupational History  . Not on file  Social Needs  . Financial resource strain: Not on file  . Food insecurity:    Worry: Not on file    Inability: Not on file  . Transportation needs:    Medical: Not on file    Non-medical: Not on file  Tobacco Use  . Smoking status: Never Smoker  . Smokeless tobacco: Never Used  Substance and Sexual Activity  . Alcohol use: Never    Frequency: Never  . Drug use: Not Currently    Types: Marijuana  . Sexual activity: Not Currently  Lifestyle  . Physical activity:    Days per week: Not on file    Minutes per session: Not on file  . Stress: Not on file  Relationships  . Social connections:    Talks on phone: Not on file    Gets together: Not on file    Attends religious service: Not on file    Active  member of club or organization: Not on file    Attends meetings of clubs or organizations: Not on file    Relationship status: Not on file  . Intimate partner violence:    Fear of current or ex partner: Not on file    Emotionally abused: Not on file    Physically abused: Not on file    Forced sexual activity: Not on file  Other Topics Concern  . Not on file  Social History Narrative  . Not on file    ALLERGIES:    No Known Allergies  CURRENT MEDICATIONS:    Current Facility-Administered Medications  Medication Dose Route Frequency Provider Last Rate Last Dose  . acetaminophen (TYLENOL) tablet 325-650 mg  325-650 mg Oral Q4H PRN Nada Libman, MD       Or  . acetaminophen (TYLENOL) suppository 325-650 mg  325-650 mg Rectal Q4H PRN Nada Libman, MD      . alum & mag hydroxide-simeth (MAALOX/MYLANTA) 200-200-20 MG/5ML suspension 15-30 mL  15-30 mL Oral Q2H PRN Nada Libman, MD      . guaiFENesin-dextromethorphan (ROBITUSSIN DM) 100-10 MG/5ML syrup 15 mL  15 mL  Oral Q4H PRN Nada LibmanBrabham, Hayzel Ruberg W, MD      . hydrALAZINE (APRESOLINE) injection 5 mg  5 mg Intravenous Q20 Min PRN Nada LibmanBrabham, Sharnice Bosler W, MD      . labetalol (NORMODYNE,TRANDATE) injection 10 mg  10 mg Intravenous Q10 min PRN Nada LibmanBrabham, Randal Yepiz W, MD      . metoprolol tartrate (LOPRESSOR) injection 2-5 mg  2-5 mg Intravenous Q2H PRN Nada LibmanBrabham, Desare Duddy W, MD      . ondansetron [facility](ZOFRAN) injection 4 mg  4 mg Intravenous Q6H PRN Nada LibmanBrabham, Eron Goble W, MD      . oxyCODONE-acetaminophen (PERCOCET/ROXICET) 5-325 MG per tablet 1-2 tablet  1-2 tablet Oral Q4H PRN Nada LibmanBrabham, Graceson Nichelson W, MD      . Melene Muller[START ON 04/02/2019] pantoprazole (PROTONIX) EC tablet 40 mg  40 mg Oral Daily Nada LibmanBrabham, Konstantine Gervasi W, MD      . phenol (CHLORASEPTIC) mouth spray 1 spray  1 spray Mouth/Throat PRN Nada LibmanBrabham, Antwan Pandya W, MD      . potassium chloride SA (K-DUR,KLOR-CON) CR tablet 20-40 mEq  20-40 mEq Oral Once Nada LibmanBrabham, Kimothy Kishimoto W, MD       Current Outpatient Medications  Medication Sig  Dispense Refill  . acetaminophen (TYLENOL) 500 MG tablet Take 1,000 mg by mouth every 6 (six) hours as needed.    . cephALEXin (KEFLEX) 500 MG capsule Take 1 capsule (500 mg total) by mouth 2 (two) times daily for 30 days. Take one capsule po BID for five days (until 03/24/2019) and then one capsule po QHS. 40 capsule 0  . methocarbamol (ROBAXIN) 500 MG tablet Take 1 tablet (500 mg total) by mouth every 8 (eight) hours as needed for muscle spasms. 30 tablet 0  . ondansetron (ZOFRAN) 4 MG tablet Take 1 tablet (4 mg total) by mouth every 8 (eight) hours as needed for nausea or vomiting. 30 tablet 1  . oxyCODONE (ROXICODONE) 5 MG immediate release tablet Take 1 tablet (5 mg total) by mouth every 6 (six) hours as needed for severe pain. 30 tablet 0  . oxyCODONE-acetaminophen (PERCOCET) 5-325 MG tablet Take 1 tablet by mouth every 6 (six) hours as needed for severe pain. (Patient not taking: Reported on 04/01/2019) 15 tablet 0  . rivaroxaban (XARELTO) 20 MG TABS tablet Take 1 tablet (20 mg total) by mouth daily with supper. (Patient not taking: Reported on 04/01/2019) 90 tablet 0  . Rivaroxaban 15 & 20 MG TBPK Take as directed on package: Start with one 15mg  tablet by mouth twice a day with food. On Day 22, switch to one 20mg  tablet once a day with food. 51 each 0    REVIEW OF SYSTEMS:   [X]  denotes positive finding, [ ]  denotes negative finding Cardiac  Comments:  Chest pain or chest pressure:    Shortness of breath upon exertion:    Short of breath when lying flat:    Irregular heart rhythm:        Vascular    Pain in calf, thigh, or hip brought on by ambulation:    Pain in feet at night that wakes you up from your sleep:     Blood clot in your veins:    Leg swelling:  x       Pulmonary    Oxygen at home:    Productive cough:     Wheezing:         Neurologic    Sudden weakness in arms or legs:     Sudden numbness in arms or legs:  Sudden onset of difficulty speaking or slurred speech:     Temporary loss of vision in one eye:     Problems with dizziness:         Gastrointestinal    Blood in stool:      Vomited blood:         Genitourinary    Burning when urinating:     Blood in urine:        Psychiatric    Major depression:         Hematologic    Bleeding problems:    Problems with blood clotting too easily:        Skin    Rashes or ulcers:        Constitutional    Fever or chills:     PHYSICAL EXAM:   Vitals:   04/01/19 1950 04/01/19 2132 04/01/19 2200  BP: 119/87 127/85 131/89  Pulse: 92 82 84  Resp: 18 13 14   Temp: 98.5 F (36.9 C)    TempSrc: Oral    SpO2: 97% 100% 99%    GENERAL: The patient is a well-nourished male, in no acute distress. The vital signs are documented above. CARDIAC: There is a regular rate and rhythm.  VASCULAR: palpable pedal pulses PULMONARY: Nonlabored respirations MUSCULOSKELETAL: There are no major deformities or cyanosis. NEUROLOGIC: No focal weakness or paresthesias are detected. SKIN: There are no ulcers or rashes noted. PSYCHIATRIC: The patient has a normal affect.  STUDIES:   I have reviewed his duplex with the following findings:  Acute deep venous thrombosis throughout the right lower extremity as detailed, which is occlusive in the femoral and popliteal veins. Mobile deep venous thrombosis in right common femoral and profunda veins.  ASSESSMENT and PLAN   Right leg DVT;  The patient does not show signs of phlegmesia.  He will be admitted for IV heparin for possible DOAC failure.  He will be NPO after midnight for ilio-caval venous u/s tomorrow am.  Based on theses results, we will consider venous intervention.  I will place him in 20-30 thigh compression stockings tonight.  His recent penile surgery is a relative contradiction for lytic therapy, but he may be a candidate for mechanical thrombecotmy with the clot-triver device.   Charlena Cross, MD, FACS [facility] [phone] Pager [phone]

## 2019-04-01 NOTE — Progress Notes (Signed)
ANTICOAGULATION CONSULT NOTE - Initial Consult  Pharmacy Consult:  Heparin Indication:  Right leg DVT  No Known Allergies  Patient Measurements:   Heparin Dosing Weight: 86 kg  Vital Signs: Temp: 98.5 F (36.9 C) (04/15 1950) Temp Source: Oral (04/15 1950) BP: 131/89 (04/15 2200) Pulse Rate: 84 (04/15 2200)  Labs: Recent Labs    04/01/19 1513  HGB 14.1  HCT 40.7  PLT 263  CREATININE 0.87    Estimated Creatinine Clearance: 135 mL/min (by C-G formula based on SCr of 0.87 mg/dL).   Medical History: Past Medical History:  Diagnosis Date  . Back pain      Assessment: 32 YOM diagnosed with right leg DVT on 03/29/19 and started on Xarelto.  Symptoms did not improve and patient was sent to the ED for further management.  Pharmacy consulted to transition patient to IV heparin.  Patient reports he last took Xarelto today around 0800.  Baseline labs reviewed.  Goal of Therapy:  Heparin level 0.3-0.7 units/ml aPTT 66 - 102 seconds Monitor platelets by anticoagulation protocol: Yes   Plan:  Heparin gtt at 1350 units/hr, no bolus with Xarelto given this AM and recent surgery Check 6 hr heparin level and aPTT Daily aPTT, heparin level and CBC   Gaige Fussner D. Laney Potash, PharmD, BCPS, BCCCP 04/01/2019, 10:29 PM

## 2019-04-01 NOTE — ED Notes (Signed)
ED TO INPATIENT HANDOFF REPORT  ED Nurse Name and Phone #:  Dorathy Daft [phone]  S Name/Age/Gender Oscar Kaiser 33 y.o. male Room/Bed: 019C/019C  Code Status   Code Status: Full Code  Home/SNF/Other Home Patient oriented to: self, place, time and situation Is this baseline? Yes   Triage Complete: Triage complete  Chief Complaint R leg pain  Triage Note Pt sent here from PCP for admission and IV heparin for a blood clot in his Right upper leg. Pt was told by PCP that he was putting in direct admission orders but no orders were put in. Per PCP note the redness and swelling and gotten worse. Pt has foley in place.    Allergies No Known Allergies  Level of Care/Admitting Diagnosis ED Disposition    ED Disposition Condition Comment   Admit  Hospital Area: [facility] [[id number]]  Level of Care: Progressive [102]  Diagnosis: DVT of deep femoral vein, left [facility]) [[id number]]  Admitting Physician: Nada Libman [[id number]]  Attending Physician: Nada Libman [[id number]]  Possible Covid Disease Patient Isolation: N/A  Bed request comments: 4 east  PT Class (Do Not Modify): Observation [104]  PT Acc Code (Do Not Modify): Observation [10022]       B Medical/Surgery History Past Medical History:  Diagnosis Date  . Back pain    Past Surgical History:  Procedure Laterality Date  . CYSTOSCOPY N/A 03/20/2019   Procedure: CYSTOSCOPY FLEXIBLE;  Surgeon: Jerilee Field, MD;  Location: [facility];  Service: Urology;  Laterality: N/A;  . REPAIR OF FRACTURED PENIS N/A 03/20/2019   Procedure: PENILE EXPLORATION AND REPAIR OF FRACTURED PENIS;  Surgeon: Jerilee Field, MD;  Location: [facility];  Service: Urology;  Laterality: N/A;     A IV Location/Drains/Wounds Patient Lines/Drains/Airways Status   Active Line/Drains/Airways    Name:   Placement date:   Placement time:   Site:   Days:   Peripheral IV 04/01/19 Left Antecubital   04/01/19    2225    Antecubital   less than 1   Urethral Catheter Dr. Mena Goes Double-lumen;Straight-tip;Latex 16 Fr.   03/20/19    0530    Double-lumen;Straight-tip;Latex   12   Incision (Closed) 03/20/19 Perineum Other (Comment)   03/20/19    0552     12          Intake/Output Last 24 hours No intake or output data in the 24 hours ending 04/01/19 2226  Labs/Imaging Results for orders placed or performed in visit on 04/01/19 (from the past 48 hour(s))  Antithrombin III     Status: None   Collection Time: 04/01/19  3:13 PM  Result Value Ref Range   AntiThromb III Func 118 75 - 120 %    Comment: Performed at [facility], [address], [place], [place] [address]   No results found.  Pending Labs Unresulted Labs (From admission, onward)    Start     Ordered   04/02/19 0500  CBC  Tomorrow morning,   R     04/01/19 2218   04/02/19 0500  Basic metabolic panel  Tomorrow morning,   R     04/01/19 2218   04/01/19 2203  HIV antibody (Routine Testing)  Once,   R     04/01/19 2204   04/01/19 2135  Type and screen [facility]  Once,   STAT    Comments:  [facility]    04/01/19 2135  Vitals/Pain Today's Vitals   04/01/19 1950 04/01/19 2132 04/01/19 2136 04/01/19 2200  BP: 119/87 127/85  131/89  Pulse: 92 82  84  Resp: 18 13  14   Temp: 98.5 F (36.9 C)     TempSrc: Oral     SpO2: 97% 100%  99%  PainSc:   5      Isolation Precautions No active isolations  Medications Medications  potassium chloride SA (K-DUR,KLOR-CON) CR tablet 20-40 mEq (has no administration in time range)  ondansetron (ZOFRAN) injection 4 mg (has no administration in time range)  alum & mag hydroxide-simeth (MAALOX/MYLANTA) 200-200-20 MG/5ML suspension 15-30 mL (has no administration in time range)  pantoprazole (PROTONIX) EC tablet 40 mg (has no administration in time range)  labetalol (NORMODYNE,TRANDATE) injection 10 mg (has no administration in time range)  hydrALAZINE (APRESOLINE) injection 5  mg (has no administration in time range)  metoprolol tartrate (LOPRESSOR) injection 2-5 mg (has no administration in time range)  guaiFENesin-dextromethorphan (ROBITUSSIN DM) 100-10 MG/5ML syrup 15 mL (has no administration in time range)  phenol (CHLORASEPTIC) mouth spray 1 spray (has no administration in time range)  acetaminophen (TYLENOL) tablet 325-650 mg (has no administration in time range)    Or  acetaminophen (TYLENOL) suppository 325-650 mg (has no administration in time range)  oxyCODONE-acetaminophen (PERCOCET/ROXICET) 5-325 MG per tablet 1-2 tablet (has no administration in time range)  methocarbamol (ROBAXIN) tablet 500 mg (has no administration in time range)  ondansetron (ZOFRAN) tablet 4 mg (has no administration in time range)    Mobility walks High fall risk   Focused Assessments    R Recommendations: See Admitting Provider Note  Report given to:   Additional Notes:

## 2019-04-01 NOTE — ED Notes (Signed)
Pt mother has called up here multiple times. Pt mother demanding to know why pt is still sitting in the lobby after multiple doctors have called up here and states pt "has a bed upstairs" Explained to mother that I spoke with the pt doctor myself and pt does not have a direct admission. Pt remains in lobby and is one of the next patients to be next but at this time I have no rooms available. I repeated myself multiple times stating the same thing and pt mother continues to ask the same question. I told pt mother I could not argue with her over the phone any more and I needed to get off the phone, if she had any other concerns she can call patient experience. Financial trader and [employer] witness to my entire conversation

## 2019-04-02 ENCOUNTER — Observation Stay (HOSPITAL_COMMUNITY): Payer: 59

## 2019-04-02 DIAGNOSIS — Z9889 Other specified postprocedural states: Secondary | ICD-10-CM | POA: Diagnosis not present

## 2019-04-02 DIAGNOSIS — I82412 Acute embolism and thrombosis of left femoral vein: Secondary | ICD-10-CM | POA: Diagnosis not present

## 2019-04-02 DIAGNOSIS — X58XXXD Exposure to other specified factors, subsequent encounter: Secondary | ICD-10-CM | POA: Diagnosis present

## 2019-04-02 DIAGNOSIS — I82401 Acute embolism and thrombosis of unspecified deep veins of right lower extremity: Secondary | ICD-10-CM

## 2019-04-02 DIAGNOSIS — Z960 Presence of urogenital implants: Secondary | ICD-10-CM | POA: Diagnosis present

## 2019-04-02 DIAGNOSIS — I82431 Acute embolism and thrombosis of right popliteal vein: Secondary | ICD-10-CM | POA: Diagnosis present

## 2019-04-02 DIAGNOSIS — I82409 Acute embolism and thrombosis of unspecified deep veins of unspecified lower extremity: Secondary | ICD-10-CM

## 2019-04-02 DIAGNOSIS — D6862 Lupus anticoagulant syndrome: Secondary | ICD-10-CM | POA: Diagnosis present

## 2019-04-02 DIAGNOSIS — S3733XD Laceration of urethra, subsequent encounter: Secondary | ICD-10-CM | POA: Diagnosis not present

## 2019-04-02 DIAGNOSIS — I82421 Acute embolism and thrombosis of right iliac vein: Secondary | ICD-10-CM | POA: Diagnosis not present

## 2019-04-02 DIAGNOSIS — S39840D Fracture of corpus cavernosum penis, subsequent encounter: Secondary | ICD-10-CM | POA: Diagnosis not present

## 2019-04-02 LAB — BASIC METABOLIC PANEL
Anion gap: 10 (ref 5–15)
BUN: 14 mg/dL (ref 6–20)
CO2: 27 mmol/L (ref 22–32)
Calcium: 9.3 mg/dL (ref 8.9–10.3)
Chloride: 101 mmol/L (ref 98–111)
Creatinine, Ser: 0.84 mg/dL (ref 0.61–1.24)
GFR calc Af Amer: >60 mL/min (ref >60)
GFR calc non Af Amer: >60 mL/min (ref >60)
Glucose, Bld: 111 mg/dL — ABNORMAL HIGH (ref 70–99)
Potassium: 4.2 mmol/L (ref 3.5–5.1)
Sodium: 138 mmol/L (ref 135–145)

## 2019-04-02 LAB — CBC
HCT: 39.8 % (ref 39.0–52.0)
Hemoglobin: 13.4 g/dL (ref 13.0–17.0)
MCH: 27.5 pg (ref 26.0–34.0)
MCHC: 33.7 g/dL (ref 30.0–36.0)
MCV: 81.7 fL (ref 80.0–100.0)
Platelets: 238 10*3/uL (ref 150–400)
RBC: 4.87 MIL/uL (ref 4.22–5.81)
RDW: 12.4 % (ref 11.5–15.5)
WBC: 7.3 10*3/uL (ref 4.0–10.5)
nRBC: 0 % (ref 0.0–0.2)

## 2019-04-02 LAB — PROTEIN S, TOTAL: Protein S Ag, Total: 141 % (ref 60–150)

## 2019-04-02 LAB — APTT
aPTT: 52 seconds — ABNORMAL HIGH (ref 24–36)
aPTT: 68 seconds — ABNORMAL HIGH (ref 24–36)
aPTT: 47 seconds — ABNORMAL HIGH (ref 24–36)

## 2019-04-02 LAB — HEPARIN LEVEL (UNFRACTIONATED)
Heparin Unfractionated: 0.69 IU/mL (ref 0.30–0.70)
Heparin Unfractionated: 0.83 IU/mL — ABNORMAL HIGH (ref 0.30–0.70)

## 2019-04-02 LAB — ABO/RH: ABO/RH(D): A POS

## 2019-04-02 LAB — PROTEIN C, TOTAL: Protein C, Total: 136 % (ref 60–150)

## 2019-04-02 LAB — PROTEIN S ACTIVITY: Protein S Activity: 120 % (ref 63–140)

## 2019-04-02 LAB — PROTEIN C ACTIVITY: Protein C Activity: 179 % (ref 73–180)

## 2019-04-02 LAB — BETA-2-GLYCOPROTEIN I ABS, IGG/M/A
Beta-2 Glyco I IgG: <9 GPI IgG units (ref 0–20)
Beta-2-Glycoprotein I IgA: <9 GPI IgA units (ref 0–25)
Beta-2-Glycoprotein I IgM: <9 GPI IgM units (ref 0–32)

## 2019-04-02 LAB — CARDIOLIPIN ANTIBODIES, IGG, IGM, IGA
Anticardiolipin IgA: <9 APL U/mL (ref 0–11)
Anticardiolipin IgG: 16 GPL U/mL — ABNORMAL HIGH (ref 0–14)
Anticardiolipin IgM: <9 MPL U/mL (ref 0–12)

## 2019-04-02 LAB — HOMOCYSTEINE: Homocysteine: 11.3 umol/L (ref 0.0–14.5)

## 2019-04-02 LAB — HIV ANTIBODY (ROUTINE TESTING W REFLEX): HIV Screen 4th Generation wRfx: NONREACTIVE

## 2019-04-02 MED ORDER — CEPHALEXIN 500 MG PO CAPS
500.0000 mg | ORAL_CAPSULE | Freq: Every day | ORAL | Status: DC
  Administered 2019-04-02 – 2019-04-04 (×4): 500 mg via ORAL
  Filled 2019-04-02 (×4): qty 1

## 2019-04-02 MED ORDER — CEFAZOLIN SODIUM-DEXTROSE 2-4 GM/100ML-% IV SOLN: 2.0000 g | INTRAVENOUS | Status: AC

## 2019-04-02 MED ORDER — BISACODYL 5 MG PO TBEC
5.0000 mg | DELAYED_RELEASE_TABLET | Freq: Every day | ORAL | Status: DC | PRN
  Administered 2019-04-02: 5 mg via ORAL
  Filled 2019-04-02: qty 1

## 2019-04-02 MED ORDER — DOCUSATE SODIUM 100 MG PO CAPS
100.0000 mg | ORAL_CAPSULE | Freq: Two times a day (BID) | ORAL | Status: DC | PRN
  Administered 2019-04-02 – 2019-04-04 (×2): 100 mg via ORAL
  Filled 2019-04-02 (×2): qty 1

## 2019-04-02 NOTE — Progress Notes (Signed)
Received and entered orders for Keflex from Dr. Elpidio Anis, see [facility]

## 2019-04-02 NOTE — Progress Notes (Signed)
IVC/Iliac venous duplex has been completed. Preliminary results can be found in CV Proc through chart review.   04/02/19 10:08 AM Olen Cordial RVT

## 2019-04-02 NOTE — Progress Notes (Signed)
ANTICOAGULATION CONSULT NOTE   Pharmacy Consult:  Heparin Indication:  Right leg DVT  No Known Allergies  Patient Measurements: Height: 6' (182.9 cm) Weight: 189 lb 6 oz (85.9 kg) IBW/kg (Calculated) : 77.6 Heparin Dosing Weight: 86 kg  Vital Signs: Temp: 98.1 F (36.7 C) (04/16 0418) Temp Source: Oral (04/16 0418) BP: 118/79 (04/16 0418) Pulse Rate: 70 (04/16 0418)  Labs: Recent Labs    04/01/19 1513 04/02/19 0337  HGB 14.1 13.4  HCT 40.7 39.8  PLT 263 238  APTT  --  52*  HEPARINUNFRC  --  0.69  CREATININE 0.87  --     Estimated Creatinine Clearance: 135 mL/min (by C-G formula based on SCr of 0.87 mg/dL).   Medical History: Past Medical History:  Diagnosis Date  . Back pain      Assessment: 32 YOM diagnosed with right leg DVT on 03/29/19 and started on Xarelto.  Symptoms did not improve and patient was sent to the ED for further management.  Pharmacy consulted to transition patient to IV heparin.  Patient reports he last took Xarelto today around 0800.  Baseline labs reviewed. Heparin level 0.69 units/mnl.  APTT 52 sec.  Labs drawn ~ 3.5 hours after start of drip  Goal of Therapy:  Heparin level 0.3-0.7 units/ml aPTT 66 - 102 seconds Monitor platelets by anticoagulation protocol: Yes   Plan:  Increase heparin gtt to 1450 units/hr Check 6 hr heparin level and aPTT Daily aPTT, heparin level and CBC  Thanks for allowing pharmacy to be a part of this patient's care.  Talbert Cage, PharmD Clinical Pharmacist 04/02/2019, 4:35 AM

## 2019-04-02 NOTE — Progress Notes (Signed)
ANTICOAGULATION CONSULT NOTE   Pharmacy Consult:  Heparin Indication:  Right leg DVT  No Known Allergies  Patient Measurements: Height: 6' (182.9 cm) Weight: 189 lb 6 oz (85.9 kg) IBW/kg (Calculated) : 77.6 Heparin Dosing Weight: 86 kg  Vital Signs: Temp: 98.6 F (37 C) (04/16 0841) Temp Source: Oral (04/16 0841) BP: 131/73 (04/16 0841) Pulse Rate: 84 (04/16 0841)  Labs: Recent Labs    04/01/19 1513 04/02/19 0337 04/02/19 1000  HGB 14.1 13.4  --   HCT 40.7 39.8  --   PLT 263 238  --   APTT  --  52* 68*  HEPARINUNFRC  --  0.69 0.83*  CREATININE 0.87 0.84  --     Estimated Creatinine Clearance: 139.9 mL/min (by C-G formula based on SCr of 0.84 mg/dL).   Medical History: Past Medical History:  Diagnosis Date  . Back pain      Assessment: 32 YOM diagnosed with right leg extensive DVT on 03/29/19 and started on Xarelto. MD noted DVT likely due to immobility s/p repair of fracture penis 03/20/19.   Symptoms did not improve on Xarelto  and patient was sent to the ED for further management, as MD noted his concern was try to prevent postphlebitic syndrome from developing.  Pharmacy consulted to transition patient to IV heparin.  Patient reported he last took Xarelto on 4/15 around 0800.  Baseline labs reviewed. Today the aPTT is 68 sec, therapeutic  on heparin drip 1450 units/hr HL 0.83  elevated 2/2 pta Xarelto , LD 4/15  IVC /iliac venous duplex done 4/16 to eval for extension of recent DVT into iliac veins> negative   PTT 68 sec, therapeutic although just within low end of therapeutic range.  RLE extensive DVT in this 33 y.o male. CBC remains wnl. No bleeding noted .  I will  Increase heparin rate a little, aiming to keep PTT from falling below goal of 66-102 seconds.    Goal of Therapy:  Heparin level 0.3-0.7 units/ml aPTT 66 - 102 seconds Monitor platelets by anticoagulation protocol: Yes   Plan:  Increase  heparin gtt to 1500 units/hr Check 6 hr aPTT to  confirm therapeutic Daily aPTT, heparin level and CBC  Thanks for allowing pharmacy to be a part of this patient's care.  Noah Delaine, RPh Clinical Pharmacist Pager: [phone]  Please check AMION for all [facility] Pharmacy phone numbers After 10:00 PM, call Main Pharmacy [phone] 04/02/2019, 11:50 AM

## 2019-04-02 NOTE — Consult Note (Signed)
Referral MD  Reason for Referral: Extensive DVT of the right leg; severe pain in the right leg; penile fracture status post surgery 2 weeks ago  Chief Complaint  Patient presents with  . Leg Pain  : My right leg was really hurting I cannot walk.  HPI: [name] is well-known to me.  I saw him yesterday in the office for the first time.  He came in with a thrombus in the right leg.  It was incredibly painful.  Is getting worse despite being on Xarelto.  He was on Xarelto for couple of days.  I felt he needed to be admitted.  I felt he needed some type of local therapy for this thrombus.  He was subsequently admitted to 4 E.  I very much appreciate Dr. Myra GianottiBrabham for doing this.  He is on a heparin infusion right now.  There is not a lot of swelling in the right leg.  He still has pain in the right thigh.  Apparently, he has some vascular procedure today.  He might be a Doppler to test the extent of the thrombus.  My biggest concern was try to prevent postphlebitic syndrome from developing.  He is only 33 years old and I would hate to see him have a chronic problem with his right leg.  He has no cough.  Is no chest wall pain.  Is no shortness of breath.  There has been no fever.  He has had no rashes.  Overall, his performance status is ECOG 1.   Past Medical History:  Diagnosis Date  . Back pain   :  Past Surgical History:  Procedure Laterality Date  . CYSTOSCOPY N/A 03/20/2019   Procedure: CYSTOSCOPY FLEXIBLE;  Surgeon: Jerilee FieldEskridge, Matthew, MD;  Location: [facility];  Service: Urology;  Laterality: N/A;  . REPAIR OF FRACTURED PENIS N/A 03/20/2019   Procedure: PENILE EXPLORATION AND REPAIR OF FRACTURED PENIS;  Surgeon: Jerilee FieldEskridge, Matthew, MD;  Location: [facility];  Service: Urology;  Laterality: N/A;  :   Current Facility-Administered Medications:  .  acetaminophen (TYLENOL) tablet 325-650 mg, 325-650 mg, Oral, Q4H PRN **OR** acetaminophen (TYLENOL) suppository 325-650 mg, 325-650 mg,  Rectal, Q4H PRN, Nada LibmanBrabham, Vance W, MD .  alum & mag hydroxide-simeth (MAALOX/MYLANTA) 200-200-20 MG/5ML suspension 15-30 mL, 15-30 mL, Oral, Q2H PRN, Nada LibmanBrabham, Vance W, MD .  cephALEXin (KEFLEX) capsule 500 mg, 500 mg, Oral, QHS, Nada LibmanBrabham, Vance W, MD, 500 mg at 04/02/19 0024 .  guaiFENesin-dextromethorphan (ROBITUSSIN DM) 100-10 MG/5ML syrup 15 mL, 15 mL, Oral, Q4H PRN, Nada LibmanBrabham, Vance W, MD .  heparin ADULT infusion 100 units/mL (25000 units/26050mL sodium chloride 0.45%), 1,450 Units/hr, Intravenous, Continuous, Nada LibmanBrabham, Vance W, MD, Last Rate: 14.5 mL/hr at 04/02/19 0442, 1,450 Units/hr at 04/02/19 0442 .  hydrALAZINE (APRESOLINE) injection 5 mg, 5 mg, Intravenous, Q20 Min PRN, Nada LibmanBrabham, Vance W, MD .  labetalol (NORMODYNE,TRANDATE) injection 10 mg, 10 mg, Intravenous, Q10 min PRN, Nada LibmanBrabham, Vance W, MD .  methocarbamol (ROBAXIN) tablet 500 mg, 500 mg, Oral, Q8H PRN, Nada LibmanBrabham, Vance W, MD .  metoprolol tartrate (LOPRESSOR) injection 2-5 mg, 2-5 mg, Intravenous, Q2H PRN, Nada LibmanBrabham, Vance W, MD .  ondansetron [facility](ZOFRAN) injection 4 mg, 4 mg, Intravenous, Q6H PRN, Nada LibmanBrabham, Vance W, MD, 4 mg at 04/01/19 2345 .  oxyCODONE-acetaminophen (PERCOCET/ROXICET) 5-325 MG per tablet 1-2 tablet, 1-2 tablet, Oral, Q4H PRN, Nada LibmanBrabham, Vance W, MD, 1 tablet at 04/02/19 0436 .  pantoprazole (PROTONIX) EC tablet 40 mg, 40 mg, Oral, Daily, Nada LibmanBrabham, Vance W, MD .  phenol (CHLORASEPTIC) mouth spray 1 spray, 1 spray, Mouth/Throat, PRN, Nada Libman, MD:  . cephALEXin  500 mg Oral QHS  . pantoprazole  40 mg Oral Daily  :  No Known Allergies:  No family history on file.:  Social History   Socioeconomic History  . Marital status: Single    Spouse name: Not on file  . Number of children: Not on file  . Years of education: Not on file  . Highest education level: Not on file  Occupational History  . Not on file  Social Needs  . Financial resource strain: Not on file  . Food insecurity:    Worry: Not on file     Inability: Not on file  . Transportation needs:    Medical: Not on file    Non-medical: Not on file  Tobacco Use  . Smoking status: Never Smoker  . Smokeless tobacco: Never Used  Substance and Sexual Activity  . Alcohol use: Never    Frequency: Never  . Drug use: Not Currently    Types: Marijuana  . Sexual activity: Not Currently  Lifestyle  . Physical activity:    Days per week: Not on file    Minutes per session: Not on file  . Stress: Not on file  Relationships  . Social connections:    Talks on phone: Not on file    Gets together: Not on file    Attends religious service: Not on file    Active member of club or organization: Not on file    Attends meetings of clubs or organizations: Not on file    Relationship status: Not on file  . Intimate partner violence:    Fear of current or ex partner: Not on file    Emotionally abused: Not on file    Physically abused: Not on file    Forced sexual activity: Not on file  Other Topics Concern  . Not on file  Social History Narrative  . Not on file  :  Review of Systems  Constitutional: Negative.   HENT: Negative.   Eyes: Negative.   Respiratory: Negative.   Cardiovascular: Negative.   Gastrointestinal: Negative.   Genitourinary: Positive for dysuria and frequency.  Musculoskeletal: Positive for myalgias.  Skin: Negative.   Endo/Heme/Allergies: Negative.   Psychiatric/Behavioral: Negative.      Exam: Well-developed well-nourished white male in no obvious distress.  Vital signs are temperature 98.1.  Pulse 70.  Blood pressure 118/79.  An exam shows no ocular or oral lesions.  There are no palpable cervical or supraclavicular lymph nodes.  Lungs are clear bilaterally.  Cardiac exam regular rate and rhythm with no murmurs, rubs or bruits.  Abdomen is soft.  He has no fluid wave.  He has no guarding or rebound tenderness.  There might be some slight discomfort in the right lower abdomen.  Extremities does show some swelling in  the right leg.  Has a compression stocking just above his right knee.  He has no erythema in the right leg.  There is a little bit of warmth in the medial right thigh.  Neurological exam is nonfocal.  Patient Vitals for the past 24 hrs:  BP Temp Temp src Pulse Resp SpO2 Height Weight  04/02/19 0418 118/79 98.1 F (36.7 C) Oral 70 14 100 % - -  04/01/19 2301 (!) 126/95 97.8 F (36.6 C) Oral (!) 111 (!) 21 100 % 6' (1.829 m) 189 lb 6 oz (85.9 kg)  04/01/19 2200 131/89 - -  84 14 99 % - -  04/01/19 2132 127/85 - - 82 13 100 % - -  04/01/19 1950 119/87 98.5 F (36.9 C) Oral 92 18 97 % - -     Recent Labs    04/01/19 1513 04/02/19 0337  WBC 10.1 7.3  HGB 14.1 13.4  HCT 40.7 39.8  PLT 263 238   Recent Labs    04/01/19 1513 04/02/19 0337  NA 135 138  K 3.5 4.2  CL 96* 101  CO2 29 27  GLUCOSE 145* 111*  BUN 14 14  CREATININE 0.87 0.84  CALCIUM 10.3 9.3    Blood smear review: None  Pathology: None    Assessment and Plan: Mr. Hessling is a 33 year old white male.  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  He suffered a penile fracture couple weeks ago.  He had surgery for this.  Because of his immobility due to having a Foley catheter in, he developed a thrombus in the right leg.  He was put on Xarelto.  He has had clinical deterioration despite being on Xarelto.  He has had more pain in the right leg and thigh.  The concern clearly is that the thrombus is progressing.  I do not know if he has some type of anatomic issue on the right side in his pelvis that might be contributing to him having a thrombus.  I would think that some type of thrombectomy will be needed to eradicate this clot and to try to minimize his risk of a postphlebitic syndrome.  He will continue on heparin.  Again, I very much appreciate everybody's help with Mr. Weatherall.  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  I just want make sure that he gets the type of care that he deserves for serving our country.   Christin Bach, MD  Psalm 27:1

## 2019-04-02 NOTE — Progress Notes (Signed)
Compression stockings applied per MD orders. Patient complained of discomfort and pain in thigh at top of compression stocking. Patient was unable to tolerate discomfort due to swelling from blood clot, per patient request, stocking was cut for pain relief.

## 2019-04-02 NOTE — Plan of Care (Signed)

## 2019-04-02 NOTE — Progress Notes (Signed)
Patient arrived from ED to room 4E09, VSS, CHG completed, telemetry applied, CCMD verified x2, skin assessment x2 RNs. Call light in reach, will continue to monitor.

## 2019-04-03 ENCOUNTER — Encounter (HOSPITAL_COMMUNITY): Payer: Self-pay

## 2019-04-03 ENCOUNTER — Inpatient Hospital Stay (HOSPITAL_COMMUNITY)
Admission: EM | Disposition: A | Discharge status: Home / Self Care | Payer: Self-pay | Source: Ambulatory Visit | Attending: Surgery

## 2019-04-03 HISTORY — PX: PERIPHERAL VASCULAR THROMBECTOMY: CATH118306

## 2019-04-03 HISTORY — PX: INTRAVASCULAR ULTRASOUND/IVUS: CATH118244

## 2019-04-03 HISTORY — PX: PERIPHERAL VASCULAR INTERVENTION: CATH118257

## 2019-04-03 LAB — DRVVT MIX: dRVVT Mix: 70.5 s — ABNORMAL HIGH (ref 0.0–47.0)

## 2019-04-03 LAB — PROTIME-INR
INR: 1 (ref 0.8–1.2)
Prothrombin Time: 13.4 seconds (ref 11.4–15.2)

## 2019-04-03 LAB — CBC
HCT: 40.8 % (ref 39.0–52.0)
Hemoglobin: 13.6 g/dL (ref 13.0–17.0)
MCH: 27.8 pg (ref 26.0–34.0)
MCHC: 33.3 g/dL (ref 30.0–36.0)
MCV: 83.3 fL (ref 80.0–100.0)
Platelets: 255 10*3/uL (ref 150–400)
RBC: 4.9 MIL/uL (ref 4.22–5.81)
RDW: 12.4 % (ref 11.5–15.5)
WBC: 6.9 10*3/uL (ref 4.0–10.5)
nRBC: 0 % (ref 0.0–0.2)

## 2019-04-03 LAB — BASIC METABOLIC PANEL
Anion gap: 9 (ref 5–15)
BUN: 12 mg/dL (ref 6–20)
CO2: 26 mmol/L (ref 22–32)
Calcium: 9.5 mg/dL (ref 8.9–10.3)
Chloride: 101 mmol/L (ref 98–111)
Creatinine, Ser: 0.88 mg/dL (ref 0.61–1.24)
GFR calc Af Amer: >60 mL/min (ref >60)
GFR calc non Af Amer: >60 mL/min (ref >60)
Glucose, Bld: 104 mg/dL — ABNORMAL HIGH (ref 70–99)
Potassium: 4.2 mmol/L (ref 3.5–5.1)
Sodium: 136 mmol/L (ref 135–145)

## 2019-04-03 LAB — LUPUS ANTICOAGULANT PANEL
DRVVT: 95.2 s — ABNORMAL HIGH (ref 0.0–47.0)
PTT Lupus Anticoagulant: 42.4 s (ref 0.0–51.9)

## 2019-04-03 LAB — APTT: aPTT: 79 seconds — ABNORMAL HIGH (ref 24–36)

## 2019-04-03 LAB — DRVVT CONFIRM: dRVVT Confirm: 1.6 ratio — ABNORMAL HIGH (ref 0.8–1.2)

## 2019-04-03 LAB — HEPARIN LEVEL (UNFRACTIONATED): Heparin Unfractionated: 0.48 IU/mL (ref 0.30–0.70)

## 2019-04-03 SURGERY — PERIPHERAL VASCULAR THROMBECTOMY
Anesthesia: LOCAL | Laterality: Right

## 2019-04-03 MED ORDER — FENTANYL CITRATE (PF) 100 MCG/2ML IJ SOLN
INTRAMUSCULAR | Status: AC
  Filled 2019-04-03: qty 2

## 2019-04-03 MED ORDER — IODIXANOL 320 MG/ML IV SOLN
INTRAVENOUS | Status: DC | PRN
  Administered 2019-04-03: 17:00:00 50 mL via INTRAVENOUS

## 2019-04-03 MED ORDER — HEPARIN SODIUM (PORCINE) 1000 UNIT/ML IJ SOLN
INTRAMUSCULAR | Status: AC
  Filled 2019-04-03: qty 1

## 2019-04-03 MED ORDER — MIDAZOLAM HCL 2 MG/2ML IJ SOLN
INTRAMUSCULAR | Status: AC
  Filled 2019-04-03: qty 2

## 2019-04-03 MED ORDER — LABETALOL HCL 5 MG/ML IV SOLN: 10.0000 mg | INTRAVENOUS | Status: DC | PRN

## 2019-04-03 MED ORDER — OXYCODONE HCL 5 MG PO TABS
5.0000 mg | ORAL_TABLET | ORAL | Status: DC | PRN
  Administered 2019-04-04: 5 mg via ORAL
  Administered 2019-04-05 (×2): 10 mg via ORAL
  Filled 2019-04-03 (×2): qty 2
  Filled 2019-04-03: qty 1

## 2019-04-03 MED ORDER — LIDOCAINE HCL (PF) 1 % IJ SOLN
INTRAMUSCULAR | Status: DC | PRN
  Administered 2019-04-03: 15 mL via SUBCUTANEOUS

## 2019-04-03 MED ORDER — SODIUM CHLORIDE 0.9 % IV SOLN: 250.0000 mL | INTRAVENOUS | Status: DC | PRN

## 2019-04-03 MED ORDER — BISACODYL 5 MG PO TBEC
10.0000 mg | DELAYED_RELEASE_TABLET | Freq: Every day | ORAL | Status: DC | PRN
  Administered 2019-04-03: 10 mg via ORAL
  Filled 2019-04-03: qty 2

## 2019-04-03 MED ORDER — HEPARIN (PORCINE) IN NACL 1000-0.9 UT/500ML-% IV SOLN
INTRAVENOUS | Status: AC
  Filled 2019-04-03: qty 1000

## 2019-04-03 MED ORDER — KETOROLAC TROMETHAMINE 30 MG/ML IJ SOLN
30.0000 mg | Freq: Four times a day (QID) | INTRAMUSCULAR | Status: DC | PRN
  Administered 2019-04-03 – 2019-04-04 (×2): 30 mg via INTRAVENOUS
  Filled 2019-04-03 (×2): qty 1

## 2019-04-03 MED ORDER — ONDANSETRON HCL 4 MG/2ML IJ SOLN: 4.0000 mg | Freq: Four times a day (QID) | INTRAMUSCULAR | Status: DC | PRN

## 2019-04-03 MED ORDER — FENTANYL CITRATE (PF) 100 MCG/2ML IJ SOLN
INTRAMUSCULAR | Status: DC | PRN
  Administered 2019-04-03: 50 ug via INTRAVENOUS
  Administered 2019-04-03 (×2): 25 ug via INTRAVENOUS
  Administered 2019-04-03: 50 ug via INTRAVENOUS

## 2019-04-03 MED ORDER — LIDOCAINE HCL (PF) 1 % IJ SOLN
INTRAMUSCULAR | Status: AC
  Filled 2019-04-03: qty 30

## 2019-04-03 MED ORDER — HYDRALAZINE HCL 20 MG/ML IJ SOLN: 5.0000 mg | INTRAMUSCULAR | Status: DC | PRN

## 2019-04-03 MED ORDER — SODIUM CHLORIDE 0.9% FLUSH: 3.0000 mL | INTRAVENOUS | Status: DC | PRN

## 2019-04-03 MED ORDER — SODIUM CHLORIDE 0.9 % IV SOLN
INTRAVENOUS | Status: AC
  Administered 2019-04-03: 19:00:00 via INTRAVENOUS

## 2019-04-03 MED ORDER — ACETAMINOPHEN 325 MG PO TABS: 650.0000 mg | ORAL_TABLET | ORAL | Status: DC | PRN

## 2019-04-03 MED ORDER — SODIUM CHLORIDE 0.9 % IV SOLN
INTRAVENOUS | Status: AC | PRN
  Administered 2019-04-03: 100 mL/h via INTRAVENOUS

## 2019-04-03 MED ORDER — HYDROMORPHONE HCL 1 MG/ML IJ SOLN: 0.5000 mg | INTRAMUSCULAR | Status: DC | PRN

## 2019-04-03 MED ORDER — SODIUM CHLORIDE 0.9% FLUSH
3.0000 mL | Freq: Two times a day (BID) | INTRAVENOUS | Status: DC
  Administered 2019-04-03 – 2019-04-05 (×4): 3 mL via INTRAVENOUS

## 2019-04-03 MED ORDER — HEPARIN SODIUM (PORCINE) 1000 UNIT/ML IJ SOLN
INTRAMUSCULAR | Status: DC | PRN
  Administered 2019-04-03: 8000 [IU] via INTRAVENOUS

## 2019-04-03 MED ORDER — HEPARIN (PORCINE) 25000 UT/250ML-% IV SOLN
1700.0000 [IU]/h | INTRAVENOUS | Status: DC
  Administered 2019-04-03 – 2019-04-04 (×2): 1700 [IU]/h via INTRAVENOUS
  Filled 2019-04-03 (×2): qty 250

## 2019-04-03 MED ORDER — MIDAZOLAM HCL 2 MG/2ML IJ SOLN
INTRAMUSCULAR | Status: DC | PRN
  Administered 2019-04-03: 1 mg via INTRAVENOUS
  Administered 2019-04-03: 2 mg via INTRAVENOUS
  Administered 2019-04-03: 1 mg via INTRAVENOUS

## 2019-04-03 SURGICAL SUPPLY — 14 items
BAG SNAP BAND KOVER 36X36 (MISCELLANEOUS) ×4 IMPLANT
BALLN MUSTANG 12X60X75 (BALLOONS) ×4
BALLOON MUSTANG 12X60X75 (BALLOONS) ×3 IMPLANT
CATH RETRIEVER CLOT 16MMX105CM (CATHETERS) ×4 IMPLANT
CATH VISIONS PV .035 IVUS (CATHETERS) ×4 IMPLANT
COVER DOME SNAP 22 D (MISCELLANEOUS) ×4 IMPLANT
GLIDEWIRE ADV .035X260CM (WIRE) ×4 IMPLANT
KIT ENCORE 26 ADVANTAGE (KITS) ×4 IMPLANT
KIT MICROPUNCTURE NIT STIFF (SHEATH) ×4 IMPLANT
SHEATH CLOT RETRIEVER (SHEATH) ×4 IMPLANT
SHEATH PINNACLE 8F 10CM (SHEATH) ×4 IMPLANT
SHEATH PROBE COVER 6X72 (BAG) ×4 IMPLANT
STENT VICI VENOUS 14X60 (Permanent Stent) ×4 IMPLANT
TRAY PV CATH (CUSTOM PROCEDURE TRAY) ×4 IMPLANT

## 2019-04-03 NOTE — Progress Notes (Signed)
ANTICOAGULATION CONSULT NOTE   Pharmacy Consult:  Heparin Indication:  Right leg DVT  No Known Allergies  Patient Measurements: Height: 6' (182.9 cm) Weight: 189 lb 6 oz (85.9 kg) IBW/kg (Calculated) : 77.6 Heparin Dosing Weight: 86 kg  Vital Signs: Temp: 98.4 F (36.9 C) (04/16 2352) Temp Source: Oral (04/16 2352) BP: 115/76 (04/16 2352) Pulse Rate: 60 (04/16 2352)  Labs: Recent Labs    04/01/19 1513  04/02/19 0337 04/02/19 1000 04/02/19 2128 04/03/19 0646  HGB 14.1  --  13.4  --   --  13.6  HCT 40.7  --  39.8  --   --  40.8  PLT 263  --  238  --   --  255  APTT  --    < > 52* 68* 47* 79*  LABPROT  --   --   --   --   --  13.4  INR  --   --   --   --   --  1.0  HEPARINUNFRC  --   --  0.69 0.83*  --  0.48  CREATININE 0.87  --  0.84  --   --   --    < > = values in this interval not displayed.    Estimated Creatinine Clearance: 139.9 mL/min (by C-G formula based on SCr of 0.84 mg/dL).   Medical History: Past Medical History:  Diagnosis Date  . Back pain      Assessment: 32 YOM diagnosed with right leg extensive DVT on 03/29/19 and started on Xarelto. MD noted DVT likely due to immobility s/p repair of fracture penis 03/20/19. Possible thrombectomy today. Plans noted for possible outpatient lovenox -heparin level= , aPTT     Goal of Therapy:  Heparin level 0.3-0.7 units/ml aPTT 66 - 102 seconds Monitor platelets by anticoagulation protocol: Yes   Plan:  -No heparin changes needed -Daily heparin level and CBC -Will follow plans post procedure  Harland German, PharmD Clinical Pharmacist **Pharmacist phone directory can now be found on [email or web address] ([email or web address]).  Listed under [facility].

## 2019-04-03 NOTE — Progress Notes (Signed)
Oscar Kaiser is slowly improving.  He had a Doppler yesterday.  This did not show any extension to the pelvic vessels.  He may have a thrombectomy today.  His right thigh is still swollen.  Is not as painful.  He is still doing well on the heparin infusion.  His thrombophilic panel is still negative for any obvious hypercoagulable condition.  He has a minimally elevated IgG anticardiolipin antibody which I do not think is clinically meaningful.  1 of his concerns is that on Monday, he is supposed to have sutures removed from his urologic surgery and the Foley catheter removed.  He was wondering if he might be able to have urology take care of this while he is in the hospital.  This would be a lot easier for him if it could be done.  Since he is on heparin right now, this could be easily stopped for any minor urological procedure.  He has had no chest wall pain.  He has had no bleeding.  He has had no fever.  His vital signs look stable.  Temperature 98.4.  Pulse 60.  Blood pressure 115/76.  Head neck exam shows no ocular or oral lesions.  His lungs are clear.  There is no wheezes.  Cardiac exam regular rate and rhythm.  There are no murmurs rubs or bruits.  Abdomen is soft.  There is no guarding or rebound tenderness.  There is no palpable liver or spleen tip.  Extremities does show the swelling in the right thigh.  It does not seem to be as firm.  There is some tenderness to palpation with the right thigh.  He still has the Homans sign on the right side.  Left leg is unremarkable.  [name] has the extensive thrombus in the right thigh.  Maybe, today, he will have a thrombectomy.  As far as outpatient anticoagulation goes, I probably would favor Lovenox for right now.  I think this would be effective.  I think once we get a little bit further out from his initial presentation, then we can make a switch over to an oral anticoagulant.  It is obvious that he is getting great care by all the staff  on 4 E.   Christin Bach, MD  Oscar Kaiser 5:16

## 2019-04-03 NOTE — Plan of Care (Signed)
  Problem: Clinical Measurements: Goal: Will remain free from infection Outcome: Progressing Goal: Respiratory complications will improve Outcome: Progressing Goal: Cardiovascular complication will be avoided Outcome: Progressing   

## 2019-04-03 NOTE — Progress Notes (Signed)
  Progress Note    04/03/2019 3:37 PM Day of Surgery  Subjective:  Right thigh pain exacerbated with walking to restroom  Vitals:   04/03/19 1229 04/03/19 1531  BP: (!) 144/88   Pulse: 60   Resp: 12   Temp: 98.7 F (37.1 C)   SpO2: 97% 100%    Physical Exam: aaox3 rle with non pitting edema  CBC    Component Value Date/Time   WBC 6.9 04/03/2019 0646   RBC 4.90 04/03/2019 0646   HGB 13.6 04/03/2019 0646   HGB 14.1 04/01/2019 1513   HCT 40.8 04/03/2019 0646   PLT 255 04/03/2019 0646   PLT 263 04/01/2019 1513   MCV 83.3 04/03/2019 0646   MCH 27.8 04/03/2019 0646   MCHC 33.3 04/03/2019 0646   RDW 12.4 04/03/2019 0646   LYMPHSABS 1.6 04/01/2019 1513   MONOABS 0.6 04/01/2019 1513   EOSABS 0.1 04/01/2019 1513   BASOSABS 0.1 04/01/2019 1513    BMET    Component Value Date/Time   NA 136 04/03/2019 0646   K 4.2 04/03/2019 0646   CL 101 04/03/2019 0646   CO2 26 04/03/2019 0646   GLUCOSE 104 (H) 04/03/2019 0646   BUN 12 04/03/2019 0646   CREATININE 0.88 04/03/2019 0646   CREATININE 0.87 04/01/2019 1513   CALCIUM 9.5 04/03/2019 0646   GFRNONAA >60 04/03/2019 0646   GFRNONAA >60 04/01/2019 1513   GFRAA >60 04/03/2019 0646   GFRAA >60 04/01/2019 1513    INR    Component Value Date/Time   INR 1.0 04/03/2019 0646     Intake/Output Summary (Last 24 hours) at 04/03/2019 1537 Last data filed at 04/03/2019 0357 Gross per 24 hour  Intake 405 ml  Output 1100 ml  Net -695 ml     Assessment:  33 y.o. male is s/p surgery on his penis now with dvt  Plan: Venogram with IVUS and possible thrombectomy today in pv lab.  Oscar Brislin C. Randie Heinz, MD [facility] Office: [phone] Pager: [phone]  04/03/2019 3:37 PM

## 2019-04-03 NOTE — Plan of Care (Signed)
  Problem: Health Behavior/Discharge Planning: Goal: Ability to manage health-related needs will improve Outcome: Progressing   Problem: Clinical Measurements: Goal: Will remain free from infection Outcome: Progressing   Problem: Activity: Goal: Risk for activity intolerance will decrease Outcome: Progressing   Problem: Clinical Measurements: Goal: Respiratory complications will improve Outcome: Completed/Met Goal: Cardiovascular complication will be avoided Outcome: Completed/Met

## 2019-04-03 NOTE — Progress Notes (Signed)
ANTICOAGULATION CONSULT NOTE - Follow Up Consult  Pharmacy Consult for heparin Indication: extensive DVT   Labs: Recent Labs    04/01/19 1513 04/02/19 0337 04/02/19 1000 04/02/19 2128  HGB 14.1 13.4  --   --   HCT 40.7 39.8  --   --   PLT 263 238  --   --   APTT  --  52* 68* 47*  HEPARINUNFRC  --  0.69 0.83*  --   CREATININE 0.87 0.84  --   --     Assessment: 33yo male now subtherapeutic on heparin despite rate increase earlier.  Goal of Therapy:  aPTT 66-102 seconds   Plan:  Will increase heparin gtt by 2-3 units/kg/hr to 1700 units/hr and check PTT in 6 hours.    Vernard Gambles, PharmD, BCPS  04/03/2019,12:34 AM

## 2019-04-03 NOTE — Op Note (Signed)
    Patient name: Oscar Kaiser MRN: [id number] DOB: June 03, 1986 Sex: male  04/03/2019 Pre-operative Diagnosis: extensive right lower extremity dvt  Post-operative diagnosis:  Same Surgeon:  Luanna Salk. Randie Heinz, MD Procedure Performed: 1.  Ultrasound-guided cannulation right popliteal vein 2.  Intravascular ultrasound of right popliteal, femoral, common femoral, external and common iliac veins and IVC 3.  Right lower extremity venogram 4.  Stent of right common iliac vein with 14 x 60 mm Vici 5.  Balloon angioplasty of right external iliac vein and common femoral vein with 12 mm balloon 6.  Mechanical thrombectomy of right common iliac, external iliac, common femoral vein and femoral vein with Inari clottriever  Indications: 33 year old male with extensive right lower extremity DVT after having surgery for penile fracture.  He is now indicated for possible right lower extremity chemical thrombectomy  Findings: Popliteal vein had acute appearing thrombus.  The popliteal up through the femoral common femoral veins all had acute appearing thrombus by intravascular ultrasound.  Common femoral vein actually appeared to have some chronic appearing thrombus and after balloon angioplasty of this and mechanical thrombectomy this was mostly resolved.  There was a narrowing at the external iliac vein which was not diseased.  There was a nearly occluded common iliac vein down to 3 mm with a normal diameter at 13 mm.  After stenting the stenosis was resolved.  He now has inline flow centrally.   Procedure:  The patient was identified in the holding area and taken to room 8.  The patient was then placed prone on the table sterilely prepped draped bilateral popliteal fossae timeout was called.  Moderate sedation was administered with fentanyl and Versed.  Ultrasound was used to identify the popliteal vein there is no small saphenous vein.  Popliteal area was anesthetized 1% lidocaine.  Using direct ultrasound  visualization I cannulated puncture needle followed by wire and sheath.  Image was saved the permanent record.  I placed a Bentson wire followed by 8 [place] sheath.  Patient was fully heparinized.  I then placed a Glidewire advantage into the right subclavian vein.  Intravascular ultrasound was performed from the popliteal vein all the way through the IVC.  We then perform a chemical thrombectomy with 3 passes.  Completion intravascular ultrasound demonstrated the above findings.  I elected to stent the common eye vein and this was done with 14 x 60 mm stent.  This was postdilated with 12 mm balloon.  I used a 12 mm balloon to dilate the external iliac vein and common femoral vein.  I then performed repeat intravascular ultrasound which demonstrated inline flow.  Venogram demonstrated the same.  Satisfied we remove the wire and sheath pressure was held to hemostasis obtained.  He tolerated procedure well that immediate complication.   Debralee Braaksma C. Randie Heinz, MD [facility] [place] Office: [phone] Pager: [phone]

## 2019-04-03 NOTE — TOC Benefit Eligibility Note (Signed)
Transition of Care [facility]) Benefit Eligibility Note    Patient Details  Name: Nuchem Greve MRN: [id number] Date of Birth: 01-29-1986   Medication/Dose: LOVENOX  80 MG SG 12HR  Covered?: No     Prescription Coverage Preferred Pharmacy: YES([facility])  Spoke with Person/Company/Phone Number:: JIMMY([employer] # [phone])     Prior Approval: Yes(# [id number])     Additional Notes: ENOXAPARIN 80 MG SQ 12HR / COVER- YES/ CO-PAY- ZERO DOLLARS / PRIOR APPROVAL- NO    Mardene Sayer Phone Number: 04/03/2019, 11:09 AM

## 2019-04-04 LAB — HEPARIN LEVEL (UNFRACTIONATED)
Heparin Unfractionated: 1.4 IU/mL — ABNORMAL HIGH (ref 0.30–0.70)
Heparin Unfractionated: 1.16 IU/mL — ABNORMAL HIGH (ref 0.30–0.70)

## 2019-04-04 LAB — CBC
HCT: 36.6 % — ABNORMAL LOW (ref 39.0–52.0)
Hemoglobin: 12.5 g/dL — ABNORMAL LOW (ref 13.0–17.0)
MCH: 28.2 pg (ref 26.0–34.0)
MCHC: 34.2 g/dL (ref 30.0–36.0)
MCV: 82.6 fL (ref 80.0–100.0)
Platelets: 247 10*3/uL (ref 150–400)
RBC: 4.43 MIL/uL (ref 4.22–5.81)
RDW: 12.3 % (ref 11.5–15.5)
WBC: 7 10*3/uL (ref 4.0–10.5)
nRBC: 0 % (ref 0.0–0.2)

## 2019-04-04 LAB — APTT: aPTT: >200 seconds (ref 24–36)

## 2019-04-04 MED ORDER — MAGNESIUM HYDROXIDE 400 MG/5ML PO SUSP
30.0000 mL | Freq: Every day | ORAL | Status: DC | PRN
  Administered 2019-04-04: 30 mL via ORAL
  Filled 2019-04-04: qty 30

## 2019-04-04 MED ORDER — OXYCODONE HCL 5 MG PO TABS: 5.0000 mg | ORAL_TABLET | ORAL | 0 refills | Status: DC | PRN

## 2019-04-04 MED ORDER — ENOXAPARIN SODIUM 100 MG/ML ~~LOC~~ SOLN
85.0000 mg | Freq: Two times a day (BID) | SUBCUTANEOUS | Status: DC
  Administered 2019-04-04 – 2019-04-05 (×3): 85 mg via SUBCUTANEOUS
  Filled 2019-04-04 (×4): qty 1

## 2019-04-04 MED ORDER — ENOXAPARIN SODIUM 100 MG/ML ~~LOC~~ SOLN: 80.0000 mg | Freq: Two times a day (BID) | SUBCUTANEOUS | 1 refills | Status: DC

## 2019-04-04 MED ORDER — ENOXAPARIN (LOVENOX) PATIENT EDUCATION KIT: PACK | Freq: Once | Status: DC

## 2019-04-04 NOTE — Progress Notes (Addendum)
ANTICOAGULATION CONSULT NOTE - Follow Up Consult  Pharmacy Consult for heparin Indication: DVT  Labs: Recent Labs    04/01/19 1513  04/02/19 0337 04/02/19 1000 04/02/19 2128 04/03/19 0646 04/04/19 0306  HGB 14.1   < > 13.4  --   --  13.6 12.5*  HCT 40.7  --  39.8  --   --  40.8 36.6*  PLT 263  --  238  --   --  255 247  APTT  --    < > 52* 68* 47* 79* >200*  LABPROT  --   --   --   --   --  13.4  --   INR  --   --   --   --   --  1.0  --   HEPARINUNFRC  --    < > 0.69 0.83*  --  0.48 1.40*  CREATININE 0.87  --  0.84  --   --  0.88  --    < > = values in this interval not displayed.    Assessment/Plan:  33yo male supratherapeutic on heparin after resumed post-op; added on PTT to confirm that this was not pt's prior DOAC effect and PTT was also elevated; suspect that this is d/t heparinization during vascular procedure. RN Lupita Leash is contacting Dr Randie Heinz w/ critical PTT value; RN states that there are no overt signs of bleeding. For now will continue gtt at current rate and recheck level in a few hours; will modify plans if Dr Randie Heinz disagrees.   Vernard Gambles, PharmD, BCPS  04/04/2019,5:50 AM

## 2019-04-04 NOTE — Progress Notes (Signed)
  Progress Note    04/04/2019 10:46 AM 1 Day Post-Op  Subjective: Having mild back and right lower extremity pain  Vitals:   04/03/19 2330 04/04/19 0419  BP: (!) 109/54 111/65  Pulse:  (!) 53  Resp:    Temp: 98.3 F (36.8 C) 98.3 F (36.8 C)  SpO2:  97%    Physical Exam: Awake alert oriented Right leg swelling improved no hematoma  CBC    Component Value Date/Time   WBC 7.0 04/04/2019 0306   RBC 4.43 04/04/2019 0306   HGB 12.5 (L) 04/04/2019 0306   HGB 14.1 04/01/2019 1513   HCT 36.6 (L) 04/04/2019 0306   PLT 247 04/04/2019 0306   PLT 263 04/01/2019 1513   MCV 82.6 04/04/2019 0306   MCH 28.2 04/04/2019 0306   MCHC 34.2 04/04/2019 0306   RDW 12.3 04/04/2019 0306   LYMPHSABS 1.6 04/01/2019 1513   MONOABS 0.6 04/01/2019 1513   EOSABS 0.1 04/01/2019 1513   BASOSABS 0.1 04/01/2019 1513    BMET    Component Value Date/Time   NA 136 04/03/2019 0646   K 4.2 04/03/2019 0646   CL 101 04/03/2019 0646   CO2 26 04/03/2019 0646   GLUCOSE 104 (H) 04/03/2019 0646   BUN 12 04/03/2019 0646   CREATININE 0.88 04/03/2019 0646   CREATININE 0.87 04/01/2019 1513   CALCIUM 9.5 04/03/2019 0646   GFRNONAA >60 04/03/2019 0646   GFRNONAA >60 04/01/2019 1513   GFRAA >60 04/03/2019 0646   GFRAA >60 04/01/2019 1513    INR    Component Value Date/Time   INR 1.0 04/03/2019 0646     Intake/Output Summary (Last 24 hours) at 04/04/2019 1046 Last data filed at 04/04/2019 0956 Gross per 24 hour  Intake 272.77 ml  Output 450 ml  Net -177.23 ml     Assessment:  33 y.o. 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 Gustavo Lah note  Apolinar Junes C. Randie Heinz, MD [facility] [place] Office:  [phone] Pager: [phone]  04/04/2019 10:46 AM

## 2019-04-04 NOTE — Progress Notes (Signed)
Patient was assisted to the [facility] w/walker. Patient stated that, "his leg felt really sore and painful. Patient felt a little lightheaded when he sat down on the toilet. Patient felt cold and teeth were chattering. Patient was unable to have BM. Patient was assisted back to the bed. . Patient given toradol. Site check and level 0. Will continue to monitor

## 2019-04-04 NOTE — Progress Notes (Signed)
It looks like vascular surgery did a fantastic job yesterday with Oscar Kaiser.  He underwent a thrombectomy.  He has stents placed.  He had opening of his venous system on the right side.  He is on heparin still.  I think he can probably be switched over to Lovenox.  I would recommend Lovenox as an outpatient for right now.  His hypercoagulable studies have not all come back yet.  He was positive for a lupus anticoagulant.  This might just be transient.  We will know if he has a true lupus anticoagulant in about 3 or 4 months.  He still is having pain in that right leg.  I think this is probably just from his surgery yesterday.  He has had no bleeding.  I spoke to his urologist yesterday.  His urologist does not want to have his Foley catheter removed until he is seen in the office for a visit.  There is no fever.  On his exam, his right leg does not look as swollen with the thigh.  He does not feel as firm.  There is no erythema.  His lungs sound clear bilaterally.  He has no rhonchi or rubs.  Cardiac exam regular rate and rhythm.  Again, I am incredibly impressed by the technical skill of vascular surgery.  They really did a great job and this really should prevent any type of postphlebitic issues from setting in.  I cannot think of any invasive procedure that he is going to need now.  That is why I think he should be transitioned over to Lovenox.  I think Lovenox as an outpatient would be the best approach for him.  Christin Bach, MD  [name] 41:10

## 2019-04-04 NOTE — Discharge Instructions (Signed)

## 2019-04-04 NOTE — Progress Notes (Signed)
Critical PTT >200. MD notified. Pharmacy will have lab redraw at 0800

## 2019-04-04 NOTE — Consult Note (Addendum)
Consultation: Penile fracture, urethral injury status post repair; Right LE DVT Requested by: Dr. Myna Hidalgo, Dr. Myra Gianotti  History of Present Illness: Mr. Fertig is a 33 year old male who suffered a serious penile fracture with urethral tear March 20, 2019.  He required exploration of the penis with closure of the right corpora, urethra and a small part of the left corpora.  A few days after surgery developed swelling in the right leg and eventually underwent mechanical thrombectomy of the right common iliac, external iliac, common femoral and femoral vein, balloon angioplasty and stenting of the right common iliac vein with Dr. Randie Heinz 04/03/2019.  He follows up in the office in 2 days and urology was consulted to consider Foley catheter removal while patient in hospital.  As far as the penile injury the patient has done well with resolution of the penile swelling and most of the ecchymosis.  Circumcision is healed well.  He is tolerating the Foley well.  He has not gotten a full erection but gotten a partial erection around the catheter and it seemed to be straight and progressing normally.  Past Medical History:  Diagnosis Date  . Back pain    Past Surgical History:  Procedure Laterality Date  . CYSTOSCOPY N/A 03/20/2019   Procedure: CYSTOSCOPY FLEXIBLE;  Surgeon: Jerilee Field, MD;  Location: [facility];  Service: Urology;  Laterality: N/A;  . REPAIR OF FRACTURED PENIS N/A 03/20/2019   Procedure: PENILE EXPLORATION AND REPAIR OF FRACTURED PENIS;  Surgeon: Jerilee Field, MD;  Location: [facility];  Service: Urology;  Laterality: N/A;    Home Medications:  Medications Prior to Admission  Medication Sig Dispense Refill Last Dose  . acetaminophen (TYLENOL) 500 MG tablet Take 1,000 mg by mouth every 6 (six) hours as needed for mild pain.    04/01/2019 at Unknown time  . cephALEXin (KEFLEX) 500 MG capsule Take 1 capsule (500 mg total) by mouth 2 (two) times daily for 30 days. Take one capsule po BID for five  days (until 03/24/2019) and then one capsule po QHS. (Patient taking differently: Take 500 mg by mouth at bedtime. Take one capsule po BID for five days (until 03/24/2019) and then one capsule po QHS.) 40 capsule 0 03/31/2019 at Unknown time  . docusate sodium (COLACE) 100 MG capsule Take 100 mg by mouth 2 (two) times daily as needed for mild constipation.   04/01/2019 at Unknown time  . methocarbamol (ROBAXIN) 500 MG tablet Take 1 tablet (500 mg total) by mouth every 8 (eight) hours as needed for muscle spasms. 30 tablet 0 04/01/2019 at Unknown time  . oxyCODONE (ROXICODONE) 5 MG immediate release tablet Take 1 tablet (5 mg total) by mouth every 6 (six) hours as needed for severe pain. 30 tablet 0 04/01/2019 at Unknown time  . Rivaroxaban 15 & 20 MG TBPK Take as directed on package: Start with one  tablet by mouth twice a day with food. On Day 22, switch to one  tablet once a day with food. 51 each 0 04/01/2019 at 0800  . ondansetron (ZOFRAN) 4 MG tablet Take 1 tablet (4 mg total) by mouth every 8 (eight) hours as needed for nausea or vomiting. (Patient not taking: Reported on 04/01/2019) 30 tablet 1 Not Taking at Unknown time  . oxyCODONE-acetaminophen (PERCOCET) 5-325 MG tablet Take 1 tablet by mouth every 6 (six) hours as needed for severe pain. (Patient not taking: Reported on 04/01/2019) 15 tablet 0 Not Taking at Unknown time  . rivaroxaban (XARELTO) 20 MG  TABS tablet Take 1 tablet (20 mg total) by mouth daily with supper. (Patient not taking: Reported on 04/01/2019) 90 tablet 0 Not Taking at Unknown time   Allergies: No Known Allergies  History reviewed. No pertinent family history. Social History:  reports that he has never smoked. He has never used smokeless tobacco. He reports previous drug use. Drug: Marijuana. He reports that he does not drink alcohol.  ROS: A complete review of systems was performed.  All systems are negative except for pertinent findings as noted. Review of Systems  All  other systems reviewed and are negative.    Physical Exam:  Vital signs in last 24 hours: Temp:  [98.3 F (36.8 C)-98.6 F (37 C)] 98.3 F (36.8 C) (04/18 0419) Pulse Rate:  [0-89] 53 (04/18 0419) Resp:  [0-68] 13 (04/17 1717) BP: (0-152)/(0-106) 111/65 (04/18 0419) SpO2:  [0 %-100 %] 97 % (04/18 0419) General:  Alert and oriented, No acute distress HEENT: Normocephalic, atraumatic Cardiovascular: Regular rate and rhythm Lungs: Regular rate and effort Abdomen: Soft, nontender, nondistended, no abdominal masses Back: No CVA tenderness Extremities: Right lower extremity edema, compression stockings on Neurologic: Grossly intact GU: Foley catheter in place, penis appears normal with resolution of most of the swelling and most of the ecchymosis.  Circumcision is clean dry and intact.  Foley in place with clear urine.  The meatus is a little tight around the Foley.  Laboratory Data:  Results for orders placed or performed during the hospital encounter of 04/01/19 (from the past 24 hour(s))  CBC     Status: Abnormal   Collection Time: 04/04/19  3:06 AM  Result Value Ref Range   WBC 7.0 4.0 - 10.5 K/uL   RBC 4.43 4.22 - 5.81 MIL/uL   Hemoglobin 12.5 (L) 13.0 - 17.0 g/dL   HCT 78.236.6 (L) 95.639.0 - 21.352.0 %   MCV 82.6 80.0 - 100.0 fL   MCH 28.2 26.0 - 34.0 pg   MCHC 34.2 30.0 - 36.0 g/dL   RDW 08.612.3 57.811.5 - 46.915.5 %   Platelets 247 150 - 400 K/uL   nRBC 0.0 0.0 - 0.2 %  Heparin level (unfractionated)     Status: Abnormal   Collection Time: 04/04/19  3:06 AM  Result Value Ref Range   Heparin Unfractionated 1.40 (H) 0.30 - 0.70 IU/mL  APTT     Status: Abnormal   Collection Time: 04/04/19  3:06 AM  Result Value Ref Range   aPTT >200 (HH) 24 - 36 seconds  Heparin level (unfractionated)     Status: Abnormal   Collection Time: 04/04/19  7:27 AM  Result Value Ref Range   Heparin Unfractionated 1.16 (H) 0.30 - 0.70 IU/mL   No results found for this or any previous visit (from the past 240  hour(s)). Creatinine: Recent Labs    03/28/19 2026 04/01/19 1513 04/02/19 0337 04/03/19 0646  CREATININE 0.87 0.87 0.84 0.88    Impression/Assessment:  1) Rt LE DVT - He was positive for a lupus anticoagulant per Dr. Myna HidalgoEnnever. To transition to Lovenox. S/p mechanical thrombectomy of the right common iliac, external iliac, common femoral and femoral vein, balloon angioplasty and stenting of the right common iliac vein with Dr. Randie Heinzain 04/03/2019 2) Penile fracture, urethral tear status post repair March 20, 2019-  Plan:  Patient understandably very concerned about the Foley catheter removal and any procedures needed to determine if the Foley can come out. He needs a pericatheter retrograde urethrogram or a flexible cystoscopy to determine  if the Foley can be removed and/or remain out. There is not a lot of room around the catheter and I think he will have a little bit of trouble with a pericatheter retrograde urethrogram here in the hospital.  I talked to the patient about utilizing nitrous in office Monday for peri-catheter RUG or flex cysto and he's much more comfortable with that.  He can remain on blood thinners from a urologic point of view.  I will plan to see him back Monday in the office.  Jerilee Field 04/04/2019, 2:23 PM

## 2019-04-04 NOTE — Progress Notes (Signed)
ANTICOAGULATION CONSULT NOTE   Pharmacy Consult:  Heparin > Lovenox Indication:  Right leg DVT  No Known Allergies  Patient Measurements: Height: 6' (182.9 cm) Weight: 189 lb 6 oz (85.9 kg) IBW/kg (Calculated) : 77.6 Heparin Dosing Weight: 86 kg  Vital Signs: Temp: 98.3 F (36.8 C) (04/18 0419) Temp Source: Oral (04/18 0419) BP: 111/65 (04/18 0419) Pulse Rate: 53 (04/18 0419)  Labs: Recent Labs    04/01/19 1513  04/02/19 0337  04/02/19 2128 04/03/19 0646 04/04/19 0306 04/04/19 0727  HGB 14.1   < > 13.4  --   --  13.6 12.5*  --   HCT 40.7  --  39.8  --   --  40.8 36.6*  --   PLT 263  --  238  --   --  255 247  --   APTT  --   --  52*   < > 47* 79* >200*  --   LABPROT  --   --   --   --   --  13.4  --   --   INR  --   --   --   --   --  1.0  --   --   HEPARINUNFRC  --   --  0.69   < >  --  0.48 1.40* 1.16*  CREATININE 0.87  --  0.84  --   --  0.88  --   --    < > = values in this interval not displayed.    Estimated Creatinine Clearance: 133.5 mL/min (by C-G formula based on SCr of 0.88 mg/dL).   Medical History: Past Medical History:  Diagnosis Date  . Back pain      Assessment: 32 YOM diagnosed with right leg extensive DVT on 03/29/19 and started on Xarelto. MD noted DVT likely due to immobility s/p repair of fracture penis 03/20/19.   Symptoms did not improve on Xarelto and patient was sent to the ED for further management, as MD noted his concern was try to prevent postphlebitic syndrome from developing.   Pharmacy asked to transition heparin to Lovenox today since no further invasive procedure planned.  Heparin level this morning is elevated, perhaps due to boluses during procedure yesterday?  No overt bleeding or complications noted.  CBC fairly stable.  Goal of Therapy:  Heparin level 0.3-0.7 units/ml aPTT 66 - 102 seconds Monitor platelets by anticoagulation protocol: Yes   Plan:  D/c IV heparin. One hour after heparin is stopped, start Lovenox 85 mg q  12 hrs (1 mg/kg). CBC q 72 hrs while on Lovenox.  Please check AMION for all [facility] Pharmacy phone numbers After 10:00 PM, call Main Pharmacy [phone] 04/04/2019, 9:00 AM

## 2019-04-05 LAB — CBC
HCT: 35.5 % — ABNORMAL LOW (ref 39.0–52.0)
Hemoglobin: 11.8 g/dL — ABNORMAL LOW (ref 13.0–17.0)
MCH: 27.7 pg (ref 26.0–34.0)
MCHC: 33.2 g/dL (ref 30.0–36.0)
MCV: 83.3 fL (ref 80.0–100.0)
Platelets: 289 10*3/uL (ref 150–400)
RBC: 4.26 MIL/uL (ref 4.22–5.81)
RDW: 12.4 % (ref 11.5–15.5)
WBC: 5.6 10*3/uL (ref 4.0–10.5)
nRBC: 0 % (ref 0.0–0.2)

## 2019-04-05 LAB — HEPARIN LEVEL (UNFRACTIONATED): Heparin Unfractionated: 0.51 IU/mL (ref 0.30–0.70)

## 2019-04-05 MED ORDER — ENOXAPARIN (LOVENOX) PATIENT EDUCATION KIT: 1.0000 | PACK | Freq: Once | 0 refills | Status: AC

## 2019-04-05 NOTE — Progress Notes (Signed)
D/C instructions, including medications and appointments given to pt. Foley care and wound care instructions reviewed. Lovenox administration reviewed. All questions answered. IV removed, clean and intact. RW delivered to room. Girlfriend to escort home.  Versie Starks, RN

## 2019-04-05 NOTE — Progress Notes (Signed)
Pt ambulated x 6 laps in hall tolerated well

## 2019-04-05 NOTE — Progress Notes (Addendum)
[facility] [place]  Subjective  - right lower lumbar pain controlled with ice and medication.   Objective 122/79 60 97.6 F (36.4 C) (Oral) 14 99%  Intake/Output Summary (Last 24 hours) at 04/05/2019 0856 Last data filed at 04/05/2019 0434 Gross per 24 hour  Intake 963 ml  Output 950 ml  Net 13 ml    Active range of motion intact right  Right LE edema improved.  Assessment/Planning: 33 y.o. 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 Gustavo Lah note Percocet and antiinflammatories for PRN lumbar discomfort. Rolling walker for home 2-3 months f/u IVC iliac venous duplex with Dr. Ricki Miller 04/05/2019 8:56 AM --  Laboratory Lab Results: Recent Labs    04/04/19 0306 04/05/19 0306  WBC 7.0 5.6  HGB 12.5* 11.8*  HCT 36.6* 35.5*  PLT 247 289   BMET Recent Labs    04/03/19 0646  NA 136  K 4.2  CL 101  CO2 26  GLUCOSE 104*  BUN 12  CREATININE 0.88  CALCIUM 9.5    COAG Lab Results  Component Value Date   INR 1.0 04/03/2019   No results found for: PTT    I have interviewed and examined patient with PA and agree with assessment and plan above. Appears to be progressing well with expected back pain at location of stent best treated with anti inflammatory medications. Will discharge today and f/u with ivc/iliac duplex in 8 or so weeks. Dr. Myna Hidalgo knows patient's family well and his help is much appreciated with anticoagulation regimen.  Algernon Mundie C. Randie Heinz, MD [facility] [place] Office: [phone] Pager: [phone]

## 2019-04-05 NOTE — Care Management (Signed)
Pt to d/c home on Lovenox.  He has a $0 copay for Lovenox.  His pharmacy will open at 10am to verify stock.  The Lovenox kit will be delivered by our pharmacy to the room.  Bedside RN aware.  Informed by RN that patient needs a RW.  Order placed.  RW will be delivered to room prior to d/c.

## 2019-04-06 ENCOUNTER — Other Ambulatory Visit: Payer: Self-pay | Admitting: Hematology & Oncology

## 2019-04-06 ENCOUNTER — Encounter (HOSPITAL_COMMUNITY): Payer: Self-pay | Admitting: Vascular Surgery

## 2019-04-06 ENCOUNTER — Telehealth: Payer: Self-pay | Admitting: Hematology & Oncology

## 2019-04-06 LAB — PROTHROMBIN GENE MUTATION

## 2019-04-06 LAB — FACTOR 5 LEIDEN

## 2019-04-06 MED FILL — Heparin Sod (Porcine)-NaCl IV Soln 1000 Unit/500ML-0.9%: INTRAVENOUS | Qty: 1000 | Status: AC

## 2019-04-06 NOTE — Telephone Encounter (Signed)
sw pt to confirm lab/ov appt 4/22 at 315 pm per 4/20 LOS

## 2019-04-06 NOTE — Discharge Summary (Signed)
[facility] Discharge Summary   Patient ID:  Oscar Kaiser MRN: [id number] DOB/AGE: March 27, 1986 33 y.o.  Admit date: 04/01/2019 Discharge date: 04/05/2019 Date of Surgery: 04/03/2019 Surgeon: Surgeon(s): Waynetta Sandy, MD  Admission Diagnosis: R leg pain  Discharge Diagnoses:  R leg pain  Secondary Diagnoses: Past Medical History:  Diagnosis Date  . Back pain     Procedure(s): PERIPHERAL VASCULAR THROMBECTOMY PERIPHERAL VASCULAR INTERVENTION Intravascular Ultrasound/IVUS  Discharged Condition: stable  HPI:  Oscar Kaiser is a 33 y.o. male, who is s/p repair of penile fracture on 03-20-2019.  He has been somewhat immobile since his surgery.  On 4-12 a duplex revealed femoral popliteal DVT.  HE was started on Xarelto, but his pain has gotten worse.  It is more severe when he stands up and walks.  He feels better with compressions stockings.  Hospital Course:  Oscar Kaiser is a 33 y.o. male is S/P Right Procedure(s): PERIPHERAL VASCULAR THROMBECTOMY PERIPHERAL VASCULAR INTERVENTION Intravascular Ultrasound/IVUS He was seen by Dr. Marin Olp following him for hematology and he wants him placed on  Lovenox as an outpatient.   Post op he developed right lumbar and right LE discomfort.  He will continue ice/heat and antiinflammatories.  Right LE edema improved as well as mobility prior to discharge.    Foley catheter will remain in place per Urology.  F/U Monday 04/06/2019 with Dr. Jerilynn Mages. Oscar Kaiser.  Stable for discharge Lovenox 80 Q 12 per Dr. Antonieta Pert note Percocet and antiinflammatories for PRN lumbar discomfort. Rolling walker for home 2-3 months f/u IVC iliac venous duplex with Dr. Donzetta Matters  Consults:  Treatment Team:  Serafina Mitchell, MD Volanda Napoleon, MD Waynetta Sandy, MD Festus Aloe, MD  Significant Diagnostic Studies: CBC Lab Results  Component Value Date   WBC 5.6 04/05/2019   HGB 11.8 (L) 04/05/2019   HCT 35.5 (L) 04/05/2019   MCV 83.3 04/05/2019   PLT 289 04/05/2019    BMET    Component Value Date/Time   NA 136 04/03/2019 0646   K 4.2 04/03/2019 0646   CL 101 04/03/2019 0646   CO2 26 04/03/2019 0646   GLUCOSE 104 (H) 04/03/2019 0646   BUN 12 04/03/2019 0646   CREATININE 0.88 04/03/2019 0646   CREATININE 0.87 04/01/2019 1513   CALCIUM 9.5 04/03/2019 0646   GFRNONAA >60 04/03/2019 0646   GFRNONAA >60 04/01/2019 1513   GFRAA >60 04/03/2019 0646   GFRAA >60 04/01/2019 1513   COAG Lab Results  Component Value Date   INR 1.0 04/03/2019     Disposition:  Discharge to :Home Discharge Instructions    Call MD for:  redness, tenderness, or signs of infection (pain, swelling, bleeding, redness, odor or green/yellow discharge around incision site)   Complete by:  As directed    Call MD for:  severe or increased pain, loss or decreased feeling  in affected limb(s)   Complete by:  As directed    Call MD for:  temperature >100.5   Complete by:  As directed    Discharge instructions   Complete by:  As directed    Anti inflammatory as needed for discomfort.   Resume previous diet   Complete by:  As directed    Walker rolling   Complete by:  As directed      Allergies as of 04/05/2019   No Known Allergies     Medication List    STOP taking these medications   Rivaroxaban 15 & 20  MG Tbpk   rivaroxaban 20 MG Tabs tablet Commonly known as:  Xarelto     TAKE these medications   acetaminophen 500 MG tablet Commonly known as:  TYLENOL Take 1,000 mg by mouth every 6 (six) hours as needed for mild pain.   cephALEXin 500 MG capsule Commonly known as:  KEFLEX Take 1 capsule (500 mg total) by mouth 2 (two) times daily for 30 days. Take one capsule po BID for five days (until 03/24/2019) and then one capsule po QHS. What changed:  when to take this   docusate sodium 100 MG capsule Commonly known as:  COLACE Take 100 mg by mouth 2 (two) times daily as needed for mild  constipation.   enoxaparin 100 MG/ML injection Commonly known as:  LOVENOX Inject 0.8 mLs (80 mg total) into the skin every 12 (twelve) hours.   methocarbamol 500 MG tablet Commonly known as:  ROBAXIN Take 1 tablet (500 mg total) by mouth every 8 (eight) hours as needed for muscle spasms.   ondansetron 4 MG tablet Commonly known as:  ZOFRAN Take 1 tablet (4 mg total) by mouth every 8 (eight) hours as needed for nausea or vomiting.   oxyCODONE 5 MG immediate release tablet Commonly known as:  Oxy IR/ROXICODONE Take 1 tablet (5 mg total) by mouth every 4 (four) hours as needed for moderate pain. What changed:    when to take this  reasons to take this   oxyCODONE-acetaminophen 5-325 MG tablet Commonly known as:  Percocet Take 1 tablet by mouth every 6 (six) hours as needed for severe pain.     ASK your doctor about these medications   enoxaparin Kit Commonly known as:  LOVENOX 1 kit by Does not apply route once for 1 dose. Ask about: Should I take this medication?      Verbal and written Discharge instructions given to the patient. Wound care per Discharge AVS Follow-up Information    Festus Aloe, MD Follow up on 04/06/2019.   Specialty:  Urology Why:  11:00 AM  Contact information: [place] [address] [phone]        Waynetta Sandy, MD Follow up in 3 week(s).   Specialties:  Vascular Surgery, Cardiology Contact information: [address] [place] [address] [phone]           Signed: Roxy Horseman 04/06/2019, 11:36 AM

## 2019-04-08 ENCOUNTER — Telehealth: Payer: Self-pay | Admitting: *Deleted

## 2019-04-08 ENCOUNTER — Encounter: Payer: Self-pay | Admitting: Hematology & Oncology

## 2019-04-08 ENCOUNTER — Other Ambulatory Visit: Payer: Self-pay

## 2019-04-08 ENCOUNTER — Inpatient Hospital Stay (HOSPITAL_BASED_OUTPATIENT_CLINIC_OR_DEPARTMENT_OTHER): Payer: 59 | Admitting: Hematology & Oncology

## 2019-04-08 ENCOUNTER — Inpatient Hospital Stay: Payer: 59

## 2019-04-08 VITALS — BP 114/68 | HR 75 | Resp 17 | Wt 191.2 lb

## 2019-04-08 DIAGNOSIS — I82411 Acute embolism and thrombosis of right femoral vein: Secondary | ICD-10-CM

## 2019-04-08 DIAGNOSIS — M79661 Pain in right lower leg: Secondary | ICD-10-CM | POA: Diagnosis not present

## 2019-04-08 DIAGNOSIS — M791 Myalgia, unspecified site: Secondary | ICD-10-CM | POA: Insufficient documentation

## 2019-04-08 DIAGNOSIS — Z79899 Other long term (current) drug therapy: Secondary | ICD-10-CM | POA: Insufficient documentation

## 2019-04-08 DIAGNOSIS — Z7901 Long term (current) use of anticoagulants: Secondary | ICD-10-CM | POA: Diagnosis not present

## 2019-04-08 DIAGNOSIS — S39840S Fracture of corpus cavernosum penis, sequela: Secondary | ICD-10-CM | POA: Insufficient documentation

## 2019-04-08 DIAGNOSIS — Z9889 Other specified postprocedural states: Secondary | ICD-10-CM

## 2019-04-08 DIAGNOSIS — R300 Dysuria: Secondary | ICD-10-CM | POA: Insufficient documentation

## 2019-04-08 DIAGNOSIS — R0789 Other chest pain: Secondary | ICD-10-CM | POA: Insufficient documentation

## 2019-04-08 DIAGNOSIS — M549 Dorsalgia, unspecified: Secondary | ICD-10-CM | POA: Diagnosis not present

## 2019-04-08 LAB — CBC WITH DIFFERENTIAL (CANCER CENTER ONLY)
Abs Immature Granulocytes: 0.01 10*3/uL (ref 0.00–0.07)
Basophils Absolute: 0.1 10*3/uL (ref 0.0–0.1)
Basophils Relative: 2 %
Eosinophils Absolute: 0.2 10*3/uL (ref 0.0–0.5)
Eosinophils Relative: 4 %
HCT: 38.7 % — ABNORMAL LOW (ref 39.0–52.0)
Hemoglobin: 13.3 g/dL (ref 13.0–17.0)
Immature Granulocytes: 0 %
Lymphocytes Relative: 35 %
Lymphs Abs: 1.8 10*3/uL (ref 0.7–4.0)
MCH: 28.8 pg (ref 26.0–34.0)
MCHC: 34.4 g/dL (ref 30.0–36.0)
MCV: 83.8 fL (ref 80.0–100.0)
Monocytes Absolute: 0.4 10*3/uL (ref 0.1–1.0)
Monocytes Relative: 8 %
Neutro Abs: 2.7 10*3/uL (ref 1.7–7.7)
Neutrophils Relative %: 51 %
Platelet Count: 355 10*3/uL (ref 150–400)
RBC: 4.62 MIL/uL (ref 4.22–5.81)
RDW: 12.3 % (ref 11.5–15.5)
WBC Count: 5.3 10*3/uL (ref 4.0–10.5)
nRBC: 0 % (ref 0.0–0.2)

## 2019-04-08 LAB — CMP (CANCER CENTER ONLY)
ALT: 128 U/L — ABNORMAL HIGH (ref 0–44)
AST: 76 U/L — ABNORMAL HIGH (ref 15–41)
Albumin: 4.3 g/dL (ref 3.5–5.0)
Alkaline Phosphatase: 101 U/L (ref 38–126)
Anion gap: 8 (ref 5–15)
BUN: 14 mg/dL (ref 6–20)
CO2: 30 mmol/L (ref 22–32)
Calcium: 9.8 mg/dL (ref 8.9–10.3)
Chloride: 101 mmol/L (ref 98–111)
Creatinine: 0.93 mg/dL (ref 0.61–1.24)
GFR, Est AFR Am: >60 mL/min (ref >60)
GFR, Estimated: >60 mL/min (ref >60)
Glucose, Bld: 92 mg/dL (ref 70–99)
Potassium: 4.1 mmol/L (ref 3.5–5.1)
Sodium: 139 mmol/L (ref 135–145)
Total Bilirubin: 0.8 mg/dL (ref 0.3–1.2)
Total Protein: 7.4 g/dL (ref 6.5–8.1)

## 2019-04-08 LAB — D-DIMER, QUANTITATIVE: D-Dimer, Quant: 1.58 ug/mL-FEU — ABNORMAL HIGH (ref 0.00–0.50)

## 2019-04-08 MED ORDER — DABIGATRAN ETEXILATE MESYLATE 150 MG PO CAPS: 150.0000 mg | ORAL_CAPSULE | Freq: Two times a day (BID) | ORAL | 12 refills | Status: DC

## 2019-04-08 NOTE — Progress Notes (Signed)
Hematology and Oncology Follow Up Visit  Oscar Kaiser [id number] [birth year] 33 y.o. 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 twice daily --to start on Apr 20, 2019     Interim History:  [name] is back for his second office visit.  We first saw him in the office, he was miserable.  He was in a wheelchair.  He had severe pain in the right thigh and cannot walk.  He had extensive thrombus.  He was admitted.  Vascular surgery did a wonderful job with him.  He was started on a heparin infusion.  They went on and did a thrombectomy.  They also put a stent into his iliac vein.  They really opened up the blood flow out of his right leg.  He was discharged on Lovenox at 80 mg p.o. twice daily.  Our thrombophilic studies show that he is positive for a heterozygous mutation of the prothrombin II gene.  In addition, he did have a positive lupus anticoagulant.  I am not sure if this is true or not.  We will have to repeat this in about 3 months.  Again right now, he is on the Lovenox.  I think this is a very good idea for him as he did well on the heparin.  I think we keep him on Lovenox for couple weeks and then transition him over to a direct thrombin inhibitor, which is Pradaxa.  When I first saw him, I am not sure why the Xarelto was not working.  It is possible that this just may be a thrombus that just is not responsive.  Again, he is doing better.  He still has pain in the leg but this is more postoperative pain where the cannula was in his right calf muscle that they did the thrombectomy.  He has no chest wall pain.  He has had no problems with bleeding.  He did have the Foley catheter removed from his penis.  He fractured his penis.  This is still healing.  At least, he does  not have the Foley catheter to worry about.  Currently, his performance status is ECOG 1.  Medications:  Current Outpatient Medications:  .  acetaminophen (TYLENOL) 500 MG tablet, Take 1,000 mg by mouth every 6 (six) hours as needed for mild pain. , Disp: , Rfl:  .  cephALEXin (KEFLEX) 500 MG capsule, Take 1 capsule (500 mg total) by mouth 2 (two) times daily for 30 days. Take one capsule po BID for five days (until 03/24/2019) and then one capsule po QHS. (Patient taking differently: Take 500 mg by mouth at bedtime. Take one capsule po BID for five days (until 03/24/2019) and then one capsule po QHS.), Disp: 40 capsule, Rfl: 0 .  docusate sodium (COLACE) 100 MG capsule, Take 100 mg by mouth 2 (two) times daily as needed for mild constipation., Disp: , Rfl:  .  enoxaparin (LOVENOX) 100 MG/ML injection, Inject 0.8 mLs (80 mg total) into the skin every 12 (twelve) hours., Disp: 28 Syringe, Rfl: 1 .  methocarbamol (ROBAXIN) 500 MG tablet, Take 1 tablet (500 mg total) by mouth every 8 (eight) hours as needed for muscle spasms., Disp: 30 tablet, Rfl: 0 .  ondansetron (ZOFRAN) 4 MG tablet, Take 1 tablet (4 mg total) by mouth  every 8 (eight) hours as needed for nausea or vomiting., Disp: 30 tablet, Rfl: 1 .  oxyCODONE (OXY IR/ROXICODONE) 5 MG immediate release tablet, Take 1 tablet (5 mg total) by mouth every 4 (four) hours as needed for moderate pain., Disp: 30 tablet, Rfl: 0 .  oxyCODONE-acetaminophen (PERCOCET) 5-325 MG tablet, Take 1 tablet by mouth every 6 (six) hours as needed for severe pain., Disp: 15 tablet, Rfl: 0  Allergies: No Known Allergies  Past Medical History, Surgical history, Social history, and Family History were reviewed and updated.  Review of Systems: Review of Systems  Constitutional: Negative.   HENT:  Negative.   Eyes: Negative.   Respiratory: Negative.   Cardiovascular: Negative.   Gastrointestinal: Negative.   Endocrine: Negative.   Genitourinary: Positive for dysuria  and pelvic pain.   Musculoskeletal: Positive for arthralgias.  Skin: Negative.   Neurological: Negative.   Psychiatric/Behavioral: Negative.     Physical Exam:  weight is 191 lb 4 oz (86.8 kg). His blood pressure is 114/68 and his pulse is 75. His respiration is 17 and oxygen saturation is 99%.   Wt Readings from Last 3 Encounters:  04/08/19 191 lb 4 oz (86.8 kg)  04/01/19 189 lb 6 oz (85.9 kg)  04/01/19 190 lb (86.2 kg)    Physical Exam Vitals signs reviewed.  HENT:     Head: Normocephalic and atraumatic.  Eyes:     Pupils: Pupils are equal, round, and reactive to light.  Neck:     Musculoskeletal: Normal range of motion.  Cardiovascular:     Rate and Rhythm: Normal rate and regular rhythm.     Heart sounds: Normal heart sounds.  Pulmonary:     Effort: Pulmonary effort is normal.     Breath sounds: Normal breath sounds.  Abdominal:     General: Bowel sounds are normal.     Palpations: Abdomen is soft.  Musculoskeletal: Normal range of motion.        General: No tenderness or deformity.     Comments: His extremities shows a compression stocking on the right leg.  This is a thigh-high compression stocking.  He does have some slight swelling of the right thigh.  There is some slight tenderness in the right calf muscle where he had the surgical site for the thrombectomy.  He has good pulses in his distal extremities.  Left leg is unremarkable.  Lymphadenopathy:     Cervical: No cervical adenopathy.  Skin:    General: Skin is warm and dry.     Findings: No erythema or rash.  Neurological:     Mental Status: He is alert and oriented to person, place, and time.  Psychiatric:        Behavior: Behavior normal.        Thought Content: Thought content normal.        Judgment: Judgment normal.      Lab Results  Component Value Date   WBC 5.3 04/08/2019   HGB 13.3 04/08/2019   HCT 38.7 (L) 04/08/2019   MCV 83.8 04/08/2019   PLT 355 04/08/2019     Chemistry       Component Value Date/Time   NA 139 04/08/2019 1519   K 4.1 04/08/2019 1519   CL 101 04/08/2019 1519   CO2 30 04/08/2019 1519   BUN 14 04/08/2019 1519   CREATININE 0.93 04/08/2019 1519      Component Value Date/Time   CALCIUM 9.8 04/08/2019 1519   ALKPHOS 101 04/08/2019 1519  AST 76 (H) 04/08/2019 1519   ALT 128 (H) 04/08/2019 1519   BILITOT 0.8 04/08/2019 1519       Impression and Plan: Mr. Klabunde is a 33 year old white male.  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  They pulled out a large thrombus.  They put a stent in.  I am sure that the fact that he was immobilized after this penile fracture and the fact that he has the prothrombin II gene mutation was the trigger for this thrombus.  I told him that he is going to have to wear the compression stocking daily.  He can take it off at nighttime.  He will need to be on anticoagulation therapy for a year in my opinion.  This was a very significant thrombus.  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  We will, again, check the lupus anticoagulant in about 3 months.  After 1 year of therapeutic anticoagulation, I would put him on maintenance low-dose anticoagulation for a year.  I probably would not do another Doppler until 3 months.  I spent about 40 minutes with him today.  I went over the labs that showed why he had this mutation.  I have sure that he probably got it from his mother.  I will plan to have him come back in a month.  By then, he will be on Pradaxa.  His renal function is okay so Pradaxa dosing should not be a problem.  He will see vascular surgery in about 3 weeks.   Josph Macho, MD 4/22/20203:57 PM

## 2019-04-08 NOTE — Telephone Encounter (Signed)
Copied from CRM [id number]. Topic: General - Other >> Apr 07, 2019  2:26 PM Percival Spanish wrote:  Pt call to say he had another surgery and wanted to make  Dr Clent Ridges aware   Dr. Clent Ridges is aware of the other surgery.  He has been following this in his chart.

## 2019-04-09 ENCOUNTER — Telehealth: Payer: Self-pay | Admitting: Hematology & Oncology

## 2019-04-09 NOTE — Telephone Encounter (Signed)
lmom to inform pt of 5/27 appt at 1115 am per 4/22 los

## 2019-04-10 ENCOUNTER — Telehealth: Payer: Self-pay | Admitting: *Deleted

## 2019-04-10 NOTE — Telephone Encounter (Signed)
Patient is calling regarding his anticoagulation. He was to stay on Lovenox for one more week before transitioning to pradaxa. Patient calls today and states that mentally he can no longer inject himself. He states he is covered in bruises and just "can't keep going on like this.Marland Kitchenit's just too much". He would like to go ahead and start is pradaxa now.  Reviewed with Dr Myna Hidalgo. He is okay with patient going ahead with the transition. Patient notified of Dr Gustavo Lah agreement.

## 2019-04-16 ENCOUNTER — Telehealth: Payer: Self-pay | Admitting: Vascular Surgery

## 2019-04-16 NOTE — Telephone Encounter (Signed)
Pt called about appt was told the date and mld ltr 06/26/19 9am IVC/iliac 940am p/o MD

## 2019-04-16 NOTE — Telephone Encounter (Signed)
-----   Message from Lars Mage, [place] sent at 04/05/2019  9:00 AM EDT -----  S/P lysis and iliac vein stent f/u with Dr. Randie Heinz in 2-3 months with IVC iliac venous duplex

## 2019-04-17 ENCOUNTER — Encounter: Payer: Self-pay | Admitting: Vascular Surgery

## 2019-04-20 ENCOUNTER — Other Ambulatory Visit: Payer: 59

## 2019-04-20 ENCOUNTER — Ambulatory Visit: Payer: 59 | Admitting: Hematology & Oncology

## 2019-04-22 ENCOUNTER — Other Ambulatory Visit: Payer: 59

## 2019-04-22 ENCOUNTER — Ambulatory Visit: Payer: 59 | Admitting: Hematology & Oncology

## 2019-05-13 ENCOUNTER — Inpatient Hospital Stay: Payer: 59 | Attending: Hematology & Oncology | Admitting: Hematology & Oncology

## 2019-05-13 ENCOUNTER — Inpatient Hospital Stay: Payer: 59

## 2019-06-25 ENCOUNTER — Other Ambulatory Visit: Payer: Self-pay

## 2019-06-25 ENCOUNTER — Encounter: Payer: Self-pay | Admitting: Hematology & Oncology

## 2019-06-25 ENCOUNTER — Inpatient Hospital Stay (HOSPITAL_BASED_OUTPATIENT_CLINIC_OR_DEPARTMENT_OTHER): Payer: 59 | Admitting: Hematology & Oncology

## 2019-06-25 ENCOUNTER — Inpatient Hospital Stay: Payer: 59 | Attending: Hematology & Oncology

## 2019-06-25 VITALS — BP 124/72 | HR 53 | Temp 98.1°F | Resp 16 | Wt 211.0 lb

## 2019-06-25 DIAGNOSIS — Z7901 Long term (current) use of anticoagulants: Secondary | ICD-10-CM

## 2019-06-25 DIAGNOSIS — I82412 Acute embolism and thrombosis of left femoral vein: Secondary | ICD-10-CM

## 2019-06-25 DIAGNOSIS — I82411 Acute embolism and thrombosis of right femoral vein: Secondary | ICD-10-CM

## 2019-06-25 DIAGNOSIS — D6862 Lupus anticoagulant syndrome: Secondary | ICD-10-CM | POA: Insufficient documentation

## 2019-06-25 DIAGNOSIS — I824Y1 Acute embolism and thrombosis of unspecified deep veins of right proximal lower extremity: Secondary | ICD-10-CM

## 2019-06-25 LAB — D-DIMER, QUANTITATIVE: D-Dimer, Quant: <0.27 ug/mL-FEU (ref 0.00–0.50)

## 2019-06-25 LAB — CMP (CANCER CENTER ONLY)
ALT: 12 U/L (ref 0–44)
AST: 17 U/L (ref 15–41)
Albumin: 4.5 g/dL (ref 3.5–5.0)
Alkaline Phosphatase: 55 U/L (ref 38–126)
Anion gap: 6 (ref 5–15)
BUN: 18 mg/dL (ref 6–20)
CO2: 27 mmol/L (ref 22–32)
Calcium: 8.6 mg/dL — ABNORMAL LOW (ref 8.9–10.3)
Chloride: 105 mmol/L (ref 98–111)
Creatinine: 0.82 mg/dL (ref 0.61–1.24)
GFR, Est AFR Am: >60 mL/min (ref >60)
GFR, Estimated: >60 mL/min (ref >60)
Glucose, Bld: 87 mg/dL (ref 70–99)
Potassium: 4.2 mmol/L (ref 3.5–5.1)
Sodium: 138 mmol/L (ref 135–145)
Total Bilirubin: 1 mg/dL (ref 0.3–1.2)
Total Protein: 6.9 g/dL (ref 6.5–8.1)

## 2019-06-25 LAB — CBC WITH DIFFERENTIAL (CANCER CENTER ONLY)
Abs Immature Granulocytes: 0.02 10*3/uL (ref 0.00–0.07)
Basophils Absolute: 0.1 10*3/uL (ref 0.0–0.1)
Basophils Relative: 1 %
Eosinophils Absolute: 0.2 10*3/uL (ref 0.0–0.5)
Eosinophils Relative: 2 %
HCT: 40.9 % (ref 39.0–52.0)
Hemoglobin: 13.7 g/dL (ref 13.0–17.0)
Immature Granulocytes: 0 %
Lymphocytes Relative: 38 %
Lymphs Abs: 2.4 10*3/uL (ref 0.7–4.0)
MCH: 28.4 pg (ref 26.0–34.0)
MCHC: 33.5 g/dL (ref 30.0–36.0)
MCV: 84.7 fL (ref 80.0–100.0)
Monocytes Absolute: 0.5 10*3/uL (ref 0.1–1.0)
Monocytes Relative: 8 %
Neutro Abs: 3.2 10*3/uL (ref 1.7–7.7)
Neutrophils Relative %: 51 %
Platelet Count: 182 10*3/uL (ref 150–400)
RBC: 4.83 MIL/uL (ref 4.22–5.81)
RDW: 12.6 % (ref 11.5–15.5)
WBC Count: 6.3 10*3/uL (ref 4.0–10.5)
nRBC: 0 % (ref 0.0–0.2)

## 2019-06-25 NOTE — Progress Notes (Signed)
Hematology and Oncology Follow Up Visit  Renard MatterRyan James Ambrosini [id number] 06-19-1986 33 y.o. 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 twice daily --to start on Apr 20, 2019     Interim History:  [name] is back for his follow-up.  He really looks quite good.  He is working.  He is having no problems with work.  His right leg is doing okay.  Has very little swelling or pain.  He is doing well on the Pradaxa.  He apparently does not need any follow-up with vascular surgery.  I do think that he needs to have a follow-up Doppler of his right leg.  We will get this set up for him up at [facility].  This would be easiest for him to have his help there.  As far as his penile fracture is concerned, this is all healed up.  There is no further catheter into the penis.  He is "fully functional."  He has had no cough or shortness of breath.  He has had no rashes.  There is been no bleeding.  Overall, his performance status is ECOG 0.   Medications:  Current Outpatient Medications:  .  dabigatran (PRADAXA) 150 MG CAPS capsule, Take 1 capsule (150 mg total) by mouth 2 (two) times daily., Disp: 60 capsule, Rfl: 12  Allergies: No Known Allergies  Past Medical History, Surgical history, Social history, and Family History were reviewed and updated.  Review of Systems: Review of Systems  Constitutional: Negative.   HENT:  Negative.   Eyes: Negative.   Respiratory: Negative.   Cardiovascular: Negative.   Gastrointestinal: Negative.   Endocrine: Negative.   Genitourinary: Positive for dysuria and pelvic pain.   Musculoskeletal: Positive for arthralgias.  Skin: Negative.   Neurological: Negative.   Psychiatric/Behavioral: Negative.     Physical Exam:  weight is 211 lb (95.7 kg).  His oral temperature is 98.1 F (36.7 C). His blood pressure is 124/72 and his pulse is 53 (abnormal). His respiration is 16 and oxygen saturation is 100%.   Wt Readings from Last 3 Encounters:  06/25/19 211 lb (95.7 kg)  04/08/19 191 lb 4 oz (86.8 kg)  04/01/19 189 lb 6 oz (85.9 kg)    Physical Exam Vitals signs reviewed.  HENT:     Head: Normocephalic and atraumatic.  Eyes:     Pupils: Pupils are equal, round, and reactive to light.  Neck:     Musculoskeletal: Normal range of motion.  Cardiovascular:     Rate and Rhythm: Normal rate and regular rhythm.     Heart sounds: Normal heart sounds.  Pulmonary:     Effort: Pulmonary effort is normal.     Breath sounds: Normal breath sounds.  Abdominal:     General: Bowel sounds are normal.     Palpations: Abdomen is soft.  Musculoskeletal: Normal range of motion.        General: No tenderness or deformity.     Comments: His extremities shows a compression stocking on the right leg.  This is a thigh-high compression stocking.  He does have some slight swelling of the right thigh.  There is some slight tenderness in the right calf muscle where he had the surgical site for the thrombectomy.  He has good pulses in his distal  extremities.  Left leg is unremarkable.  Lymphadenopathy:     Cervical: No cervical adenopathy.  Skin:    General: Skin is warm and dry.     Findings: No erythema or rash.  Neurological:     Mental Status: He is alert and oriented to person, place, and time.  Psychiatric:        Behavior: Behavior normal.        Thought Content: Thought content normal.        Judgment: Judgment normal.      Lab Results  Component Value Date   WBC 6.3 06/25/2019   HGB 13.7 06/25/2019   HCT 40.9 06/25/2019   MCV 84.7 06/25/2019   PLT 182 06/25/2019     Chemistry      Component Value Date/Time   NA 138 06/25/2019 0756   K 4.2 06/25/2019 0756   CL 105 06/25/2019 0756   CO2 27 06/25/2019 0756   BUN 18 06/25/2019 0756    CREATININE 0.82 06/25/2019 0756      Component Value Date/Time   CALCIUM 8.6 (L) 06/25/2019 0756   ALKPHOS 55 06/25/2019 0756   AST 17 06/25/2019 0756   ALT 12 06/25/2019 0756   BILITOT 1.0 06/25/2019 0756       Impression and Plan: Mr. Simonis is a 33 year old white male.  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  Is he has been doing well on the Pradaxa.  I think the real issue is whether or not he has a actual lupus anticoagulant.  Back in mid April, there was a lupus anticoagulant.  Again this could have been transient as he was still fairly close to his initial thrombus.  We are checking this again.  We will check this when he comes back.  By then, we should deafly know whether or not this is a true clinically meaningful lupus anticoagulant.  If so, we may have to get on aspirin in addition to the Pradaxa.  I will go ahead and get the Doppler of his right leg.  I will get this in a week.  I will see him back in 4 months.  Again he needs therapeutic anticoagulation for at least a year and then maintenance after that.   Volanda Napoleon, MD 7/9/20208:44 AM

## 2019-06-26 ENCOUNTER — Encounter: Payer: Self-pay | Admitting: Family

## 2019-06-26 ENCOUNTER — Encounter: Payer: 59 | Admitting: Vascular Surgery

## 2019-06-26 ENCOUNTER — Encounter (HOSPITAL_COMMUNITY): Payer: 59

## 2019-08-17 ENCOUNTER — Other Ambulatory Visit: Payer: Self-pay

## 2019-08-17 DIAGNOSIS — I82412 Acute embolism and thrombosis of left femoral vein: Secondary | ICD-10-CM

## 2019-08-21 ENCOUNTER — Encounter: Payer: 59 | Admitting: Vascular Surgery

## 2019-08-21 ENCOUNTER — Ambulatory Visit (HOSPITAL_COMMUNITY): Admission: RE | Admit: 2019-08-21 | Payer: 59 | Source: Ambulatory Visit

## 2019-09-15 ENCOUNTER — Other Ambulatory Visit: Payer: Self-pay

## 2019-09-15 DIAGNOSIS — I824Y1 Acute embolism and thrombosis of unspecified deep veins of right proximal lower extremity: Secondary | ICD-10-CM

## 2019-09-18 ENCOUNTER — Ambulatory Visit (HOSPITAL_COMMUNITY)
Admission: RE | Admit: 2019-09-18 | Discharge: 2019-09-18 | Disposition: A | Discharge status: Home / Self Care | Payer: 59 | Source: Ambulatory Visit | Attending: Vascular Surgery | Admitting: Vascular Surgery

## 2019-09-18 ENCOUNTER — Other Ambulatory Visit: Payer: Self-pay

## 2019-09-18 ENCOUNTER — Ambulatory Visit (INDEPENDENT_AMBULATORY_CARE_PROVIDER_SITE_OTHER): Payer: 59 | Admitting: Vascular Surgery

## 2019-09-18 ENCOUNTER — Encounter: Payer: Self-pay | Admitting: Vascular Surgery

## 2019-09-18 VITALS — BP 107/67 | HR 56 | Temp 97.9°F | Resp 20 | Ht 72.0 in | Wt 210.0 lb

## 2019-09-18 DIAGNOSIS — I824Y1 Acute embolism and thrombosis of unspecified deep veins of right proximal lower extremity: Secondary | ICD-10-CM

## 2019-09-18 NOTE — Progress Notes (Signed)
Patient ID: Oscar Kaiser, male   DOB: April 02, 1986, 33 y.o.   MRN: [id number]  Reason for Consult: New Patient (Initial Visit)   Referred by Nelwyn Salisbury, MD  Subjective:     HPI:  Oscar Kaiser is a 34 y.o. male right lower extremity mechanical thrombectomy and stenting.  This is at the time of penile fracture.  All this is healed.  He is back to work.  He does wear waist high compression but complains that this falls down.  He has occasional cramping in his thighs overall getting around well.  Most swelling is started around his knee.  He remains on Pradaxa at the discretion of Dr. Myna Hidalgo.  Past Medical History:  Diagnosis Date  . Back pain   . DVT (deep venous thrombosis) ([facility])    History reviewed. No pertinent family history. Past Surgical History:  Procedure Laterality Date  . CYSTOSCOPY N/A 03/20/2019   Procedure: CYSTOSCOPY FLEXIBLE;  Surgeon: Jerilee Field, MD;  Location: [facility];  Service: Urology;  Laterality: N/A;  . INTRAVASCULAR ULTRASOUND/IVUS N/A 04/03/2019   Procedure: Intravascular Ultrasound/IVUS;  Surgeon: Maeola Harman, MD;  Location: [facility] INVASIVE CV LAB;  Service: Cardiovascular;  Laterality: N/A;  . PERIPHERAL VASCULAR INTERVENTION  04/03/2019   Procedure: PERIPHERAL VASCULAR INTERVENTION;  Surgeon: Maeola Harman, MD;  Location[facility] INVASIVE CV LAB;  Service: Cardiovascular;;  . PERIPHERAL VASCULAR THROMBECTOMY Right 04/03/2019   Procedure: PERIPHERAL VASCULAR THROMBECTOMY;  Surgeon: Maeola Harman, MD;  Location: [facility] INVASIVE CV LAB;  Service: Cardiovascular;  Laterality: Right;  . REPAIR OF FRACTURED PENIS N/A 03/20/2019   Procedure: PENILE EXPLORATION AND REPAIR OF FRACTURED PENIS;  Surgeon: Jerilee Field, MD;  Location: [facility];  Service: Urology;  Laterality: N/A;    Short Social History:  Social History   Tobacco Use  . Smoking status: Never Smoker  . Smokeless tobacco: Never Used  Substance Use Topics  .  Alcohol use: Never    Frequency: Never    No Known Allergies  Current Outpatient Medications  Medication Sig Dispense Refill  . dabigatran (PRADAXA) 150 MG CAPS capsule Take 1 capsule (150 mg total) by mouth 2 (two) times daily. 60 capsule 12   No current facility-administered medications for this visit.     Review of Systems  Constitutional:  Constitutional negative. HENT: HENT negative.  Eyes: Eyes negative.  Respiratory: Respiratory negative.  Cardiovascular: Positive for leg swelling.  GI: Gastrointestinal negative.  GU: Genitourinary negative. Musculoskeletal: Positive for leg pain.  Neurological: Neurological negative. Hematologic: Hematologic/lymphatic negative.  Psychiatric: Psychiatric negative.        Objective:  Objective   Vitals:   09/18/19 0832  BP: 107/67  Pulse: (!) 56  Resp: 20  Temp: 97.9 F (36.6 C)  SpO2: 96%  Weight: 210 lb (95.3 kg)  Height: 6' (1.829 m)   Body mass index is 28.48 kg/m.  Physical Exam Constitutional:      Appearance: Normal appearance.  HENT:     Nose: Nose normal.     Mouth/Throat:     Mouth: Mucous membranes are moist.  Eyes:     Pupils: Pupils are equal, round, and reactive to light.  Neck:     Musculoskeletal: Normal range of motion and neck supple.  Cardiovascular:     Rate and Rhythm: Normal rate.     Pulses: Normal pulses.  Pulmonary:     Effort: Pulmonary effort is normal.  Abdominal:     General: Abdomen is flat.  Musculoskeletal:     Right lower leg: Edema present.     Left lower leg: Edema present.  Skin:    General: Skin is warm and dry.     Capillary Refill: Capillary refill takes less than 2 seconds.  Neurological:     General: No focal deficit present.     Mental Status: He is alert.  Psychiatric:        Mood and Affect: Mood normal.        Behavior: Behavior normal.        Thought Content: Thought content normal.        Judgment: Judgment normal.     Data: I have independently  interpreted his IVC iliac study.  The IVC and right common external iliac veins are patent.     Assessment/Plan:     33 year old male follows up status post extensive right lower extremity DVT with intervention with mechanical thrombectomy and stenting.  Stent is patent by today's duplex study and also on physical exam he has equal swelling bilateral ankles.  I have recommended compression that is more tolerable which would likely be knee or thigh-high.  He will follow-up in 6 months with repeat studies.  Remains on Pradaxa at the discretion of Dr. Marin Olp for an indefinite period of time.     Waynetta Sandy MD [facility]

## 2019-10-27 ENCOUNTER — Other Ambulatory Visit: Payer: 59

## 2019-10-27 ENCOUNTER — Ambulatory Visit: Payer: 59 | Admitting: Hematology & Oncology

## 2020-01-12 ENCOUNTER — Encounter: Payer: Self-pay | Admitting: Family Medicine

## 2020-01-12 ENCOUNTER — Other Ambulatory Visit: Payer: Self-pay

## 2020-01-12 ENCOUNTER — Ambulatory Visit (INDEPENDENT_AMBULATORY_CARE_PROVIDER_SITE_OTHER): Payer: 59 | Admitting: Family Medicine

## 2020-01-12 DIAGNOSIS — B029 Zoster without complications: Secondary | ICD-10-CM | POA: Diagnosis not present

## 2020-01-12 MED ORDER — METHYLPREDNISOLONE 4 MG PO TBPK: ORAL_TABLET | ORAL | 0 refills | Status: DC

## 2020-01-12 MED ORDER — TRAMADOL HCL 50 MG PO TABS: 100.0000 mg | ORAL_TABLET | Freq: Four times a day (QID) | ORAL | 0 refills | Status: DC | PRN

## 2020-01-12 MED ORDER — VALACYCLOVIR HCL 1 G PO TABS: 1000.0000 mg | ORAL_TABLET | Freq: Three times a day (TID) | ORAL | 0 refills | Status: DC

## 2020-01-12 NOTE — Progress Notes (Signed)
Subjective:    Patient ID: Oscar Kaiser, male    DOB: 03/21/1986, 34 y.o.   MRN: [id number]  HPI Virtual Visit via Video Note  I connected with the patient on 01/12/20 at  1:15 PM EST by a video enabled telemedicine application and verified that I am speaking with the correct person using two identifiers.  Location patient: home Location provider:work or home office Persons participating in the virtual visit: patient, provider  I discussed the limitations of evaluation and management by telemedicine and the availability of in person appointments. The patient expressed understanding and agreed to proceed.   HPI: Here for one week of itching and burning pain that started in the left lower back and then spread to the left buttock and now the left thigh. Then several days ago he noticed patches of red blisters on the skin in these areas.    ROS: See pertinent positives and negatives per HPI.  Past Medical History:  Diagnosis Date  . Back pain   . DVT (deep venous thrombosis) ([place])     Past Surgical History:  Procedure Laterality Date  . CYSTOSCOPY N/A 03/20/2019   Procedure: CYSTOSCOPY FLEXIBLE;  Surgeon: Festus Aloe, MD;  Location: [facility];  Service: Urology;  Laterality: N/A;  . INTRAVASCULAR ULTRASOUND/IVUS N/A 04/03/2019   Procedure: Intravascular Ultrasound/IVUS;  Surgeon: Waynetta Sandy, MD;  Location: [place] CV LAB;  Service: Cardiovascular;  Laterality: N/A;  . PERIPHERAL VASCULAR INTERVENTION  04/03/2019   Procedure: PERIPHERAL VASCULAR INTERVENTION;  Surgeon: Waynetta Sandy, MD;  Location: [place] CV LAB;  Service: Cardiovascular;;  . PERIPHERAL VASCULAR THROMBECTOMY Right 04/03/2019   Procedure: PERIPHERAL VASCULAR THROMBECTOMY;  Surgeon: Waynetta Sandy, MD;  Location: [place] CV LAB;  Service: Cardiovascular;  Laterality: Right;  . REPAIR OF FRACTURED PENIS N/A 03/20/2019   Procedure: PENILE EXPLORATION AND REPAIR OF  FRACTURED PENIS;  Surgeon: Festus Aloe, MD;  Location: [facility];  Service: Urology;  Laterality: N/A;    History reviewed. No pertinent family history.   Current Outpatient Medications:  .  dabigatran (PRADAXA) 150 MG CAPS capsule, Take 1 capsule (150 mg total) by mouth 2 (two) times daily., Disp: 60 capsule, Rfl: 12 .  methylPREDNISolone (MEDROL DOSEPAK) 4 MG TBPK tablet, As directed, Disp: 21 tablet, Rfl: 0 .  traMADol (ULTRAM) 50 MG tablet, Take 2 tablets (100 mg total) by mouth every 6 (six) hours as needed., Disp: 60 tablet, Rfl: 0 .  valACYclovir (VALTREX) 1000 MG tablet, Take 1 tablet (1,000 mg total) by mouth 3 (three) times daily., Disp: 30 tablet, Rfl: 0  EXAM:  VITALS per patient if applicable:  GENERAL: alert, oriented, appears well and in no acute distress  HEENT: atraumatic, conjunttiva clear, no obvious abnormalities on inspection of external nose and ears  NECK: normal movements of the head and neck  LUNGS: on inspection no signs of respiratory distress, breathing rate appears normal, no obvious gross SOB, gasping or wheezing  CV: no obvious cyanosis  MS: moves all visible extremities without noticeable abnormality  PSYCH/NEURO: pleasant and cooperative, no obvious depression or anxiety, speech and thought processing grossly intact  ASSESSMENT AND PLAN: Shingles, treat with Valtrex and a Medrol dose pack. Add Tramadol prn.  Alysia Penna, MD  Discussed the following assessment and plan:  No diagnosis found.     I discussed the assessment and treatment plan with the patient. The patient was provided an opportunity to ask questions and all were answered. The patient agreed with  the plan and demonstrated an understanding of the instructions.   The patient was advised to call back or seek an in-person evaluation if the symptoms worsen or if the condition fails to improve as anticipated.     Review of Systems     Objective:   Physical Exam         Assessment & Plan:

## 2020-04-04 ENCOUNTER — Inpatient Hospital Stay: Payer: 59

## 2020-04-04 ENCOUNTER — Inpatient Hospital Stay: Payer: 59 | Admitting: Hematology & Oncology

## 2020-04-08 ENCOUNTER — Inpatient Hospital Stay: Payer: 59 | Attending: Hematology & Oncology

## 2020-04-08 ENCOUNTER — Encounter: Payer: Self-pay | Admitting: Hematology & Oncology

## 2020-04-08 ENCOUNTER — Other Ambulatory Visit: Payer: Self-pay

## 2020-04-08 ENCOUNTER — Inpatient Hospital Stay (HOSPITAL_BASED_OUTPATIENT_CLINIC_OR_DEPARTMENT_OTHER): Payer: 59 | Admitting: Hematology & Oncology

## 2020-04-08 ENCOUNTER — Telehealth: Payer: Self-pay | Admitting: Hematology & Oncology

## 2020-04-08 VITALS — BP 134/72 | HR 54 | Temp 97.3°F | Resp 18 | Ht 72.0 in | Wt 207.4 lb

## 2020-04-08 DIAGNOSIS — I82412 Acute embolism and thrombosis of left femoral vein: Secondary | ICD-10-CM | POA: Diagnosis not present

## 2020-04-08 DIAGNOSIS — D6862 Lupus anticoagulant syndrome: Secondary | ICD-10-CM | POA: Insufficient documentation

## 2020-04-08 DIAGNOSIS — Z7901 Long term (current) use of anticoagulants: Secondary | ICD-10-CM | POA: Diagnosis not present

## 2020-04-08 DIAGNOSIS — I82411 Acute embolism and thrombosis of right femoral vein: Secondary | ICD-10-CM

## 2020-04-08 LAB — CMP (CANCER CENTER ONLY)
ALT: 15 U/L (ref 0–44)
AST: 17 U/L (ref 15–41)
Albumin: 4.5 g/dL (ref 3.5–5.0)
Alkaline Phosphatase: 60 U/L (ref 38–126)
Anion gap: 4 — ABNORMAL LOW (ref 5–15)
BUN: 17 mg/dL (ref 6–20)
CO2: 30 mmol/L (ref 22–32)
Calcium: 9.5 mg/dL (ref 8.9–10.3)
Chloride: 106 mmol/L (ref 98–111)
Creatinine: 0.92 mg/dL (ref 0.61–1.24)
GFR, Est AFR Am: >60 mL/min (ref >60)
GFR, Estimated: >60 mL/min (ref >60)
Glucose, Bld: 101 mg/dL — ABNORMAL HIGH (ref 70–99)
Potassium: 4.3 mmol/L (ref 3.5–5.1)
Sodium: 140 mmol/L (ref 135–145)
Total Bilirubin: 0.7 mg/dL (ref 0.3–1.2)
Total Protein: 7.6 g/dL (ref 6.5–8.1)

## 2020-04-08 LAB — CBC WITH DIFFERENTIAL (CANCER CENTER ONLY)
Abs Immature Granulocytes: 0.02 10*3/uL (ref 0.00–0.07)
Basophils Absolute: 0.1 10*3/uL (ref 0.0–0.1)
Basophils Relative: 2 %
Eosinophils Absolute: 0.2 10*3/uL (ref 0.0–0.5)
Eosinophils Relative: 3 %
HCT: 42.1 % (ref 39.0–52.0)
Hemoglobin: 14 g/dL (ref 13.0–17.0)
Immature Granulocytes: 0 %
Lymphocytes Relative: 32 %
Lymphs Abs: 2.2 10*3/uL (ref 0.7–4.0)
MCH: 28.7 pg (ref 26.0–34.0)
MCHC: 33.3 g/dL (ref 30.0–36.0)
MCV: 86.3 fL (ref 80.0–100.0)
Monocytes Absolute: 0.4 10*3/uL (ref 0.1–1.0)
Monocytes Relative: 6 %
Neutro Abs: 3.8 10*3/uL (ref 1.7–7.7)
Neutrophils Relative %: 57 %
Platelet Count: 220 10*3/uL (ref 150–400)
RBC: 4.88 MIL/uL (ref 4.22–5.81)
RDW: 12.7 % (ref 11.5–15.5)
WBC Count: 6.8 10*3/uL (ref 4.0–10.5)
nRBC: 0 % (ref 0.0–0.2)

## 2020-04-08 LAB — D-DIMER, QUANTITATIVE: D-Dimer, Quant: <0.27 ug/mL-FEU (ref 0.00–0.50)

## 2020-04-08 NOTE — Progress Notes (Signed)
Hematology and Oncology Follow Up Visit  Oscar Kaiser [id number] 20-Feb-1986 33 y.o. 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 twice daily --to start on Apr 20, 2019     Interim History:  Oscar Kaiser is back for his follow-up.  He really looks quite good.  He is working.  He is having no problems with work.   His right leg is doing okay.  Has very little swelling or pain.  He is doing well on the Pradaxa.  There is been no problems with bleeding.  He has had no abdominal pain.  He does have a little bit of sciatica down the left leg.  This is been chronic.  He is had no change in bowel or bladder habits.  He has had no cough or shortness of breath.  He is not had the coronavirus vaccine yet.  I told him that he go ahead and get this.  Overall, his performance status is ECOG 0.   Medications:  Current Outpatient Medications:  .  dabigatran (PRADAXA) 150 MG CAPS capsule, Take 1 capsule (150 mg total) by mouth 2 (two) times daily., Disp: 60 capsule, Rfl: 12  Allergies: No Known Allergies  Past Medical History, Surgical history, Social history, and Family History were reviewed and updated.  Review of Systems: Review of Systems  Constitutional: Negative.   HENT:  Negative.   Eyes: Negative.   Respiratory: Negative.   Cardiovascular: Negative.   Gastrointestinal: Negative.   Endocrine: Negative.   Genitourinary: Positive for dysuria and pelvic pain.   Musculoskeletal: Positive for arthralgias.  Skin: Negative.   Neurological: Negative.   Psychiatric/Behavioral: Negative.     Physical Exam:  height is 6' (1.829 m) and weight is 207 lb 6.4 oz (94.1 kg). His temporal temperature is 97.3 F (36.3 C) (abnormal). His blood pressure is 134/72 and his pulse is 54 (abnormal).  His respiration is 18 and oxygen saturation is 100%.   Wt Readings from Last 3 Encounters:  04/08/20 207 lb 6.4 oz (94.1 kg)  09/18/19 210 lb (95.3 kg)  06/25/19 211 lb (95.7 kg)    Physical Exam Vitals reviewed.  HENT:     Head: Normocephalic and atraumatic.  Eyes:     Pupils: Pupils are equal, round, and reactive to light.  Cardiovascular:     Rate and Rhythm: Normal rate and regular rhythm.     Heart sounds: Normal heart sounds.  Pulmonary:     Effort: Pulmonary effort is normal.     Breath sounds: Normal breath sounds.  Abdominal:     General: Bowel sounds are normal.     Palpations: Abdomen is soft.  Musculoskeletal:        General: No tenderness or deformity. Normal range of motion.     Cervical back: Normal range of motion.     Comments: His extremities shows a compression stocking on the right leg.  This is a thigh-high compression stocking.  He does have some slight swelling of the right thigh.  There is some slight tenderness in the right calf muscle where he had the surgical site for the thrombectomy.  He has good pulses in his distal extremities.  Left leg is unremarkable.  Lymphadenopathy:     Cervical: No cervical adenopathy.  Skin:    General: Skin  is warm and dry.     Findings: No erythema or rash.  Neurological:     Mental Status: He is alert and oriented to person, place, and time.  Psychiatric:        Behavior: Behavior normal.        Thought Content: Thought content normal.        Judgment: Judgment normal.      Lab Results  Component Value Date   WBC 6.8 04/08/2020   HGB 14.0 04/08/2020   HCT 42.1 04/08/2020   MCV 86.3 04/08/2020   PLT 220 04/08/2020     Chemistry      Component Value Date/Time   NA 140 04/08/2020 0808   K 4.3 04/08/2020 0808   CL 106 04/08/2020 0808   CO2 30 04/08/2020 0808   BUN 17 04/08/2020 0808   CREATININE 0.92 04/08/2020 0808      Component Value Date/Time   CALCIUM 9.5 04/08/2020 0808   ALKPHOS 60 04/08/2020  0808   AST 17 04/08/2020 0808   ALT 15 04/08/2020 0808   BILITOT 0.7 04/08/2020 0808       Impression and Plan: Oscar Kaiser is a 34 year old white male.  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  Is he has been doing well on the Pradaxa.  I think the real issue is whether or not he has a actual lupus anticoagulant.  Back in mid April, there was a lupus anticoagulant.  Again this could have been transient as he was still fairly close to his initial thrombus.  We are checking this again.  We will check this when he comes back.  By then, we should deafly know whether or not this is a true clinically meaningful lupus anticoagulant.  If so, we may have to get on aspirin in addition to the Pradaxa.  I really do not think we have to get any ultrasound on his leg.  He is pretty much asymptomatic.  We will plan to get him back in 6 months now.   Volanda Napoleon, MD 4/23/20218:49 AM

## 2020-04-08 NOTE — Telephone Encounter (Signed)
Appointments scheduled calendar declined patient has My Chart Access/ 4/23 los

## 2020-04-10 ENCOUNTER — Other Ambulatory Visit: Payer: Self-pay | Admitting: Hematology & Oncology

## 2020-04-12 LAB — LUPUS ANTICOAGULANT PANEL
DRVVT: 59.6 s — ABNORMAL HIGH (ref 0.0–47.0)
PTT Lupus Anticoagulant: 49.2 s (ref 0.0–51.9)

## 2020-04-12 LAB — DRVVT CONFIRM: dRVVT Confirm: 1.1 ratio (ref 0.8–1.2)

## 2020-04-12 LAB — DRVVT MIX: dRVVT Mix: 51.3 s — ABNORMAL HIGH (ref 0.0–40.4)

## 2020-10-07 ENCOUNTER — Other Ambulatory Visit: Payer: Self-pay

## 2020-10-07 ENCOUNTER — Encounter: Payer: Self-pay | Admitting: Family

## 2020-10-07 ENCOUNTER — Inpatient Hospital Stay: Payer: 59 | Attending: Hematology & Oncology

## 2020-10-07 ENCOUNTER — Inpatient Hospital Stay (HOSPITAL_BASED_OUTPATIENT_CLINIC_OR_DEPARTMENT_OTHER): Payer: 59 | Admitting: Family

## 2020-10-07 VITALS — BP 123/78 | HR 60 | Temp 98.6°F | Resp 16 | Wt 193.0 lb

## 2020-10-07 DIAGNOSIS — I82412 Acute embolism and thrombosis of left femoral vein: Secondary | ICD-10-CM

## 2020-10-07 DIAGNOSIS — M5432 Sciatica, left side: Secondary | ICD-10-CM | POA: Insufficient documentation

## 2020-10-07 DIAGNOSIS — I82501 Chronic embolism and thrombosis of unspecified deep veins of right lower extremity: Secondary | ICD-10-CM | POA: Diagnosis present

## 2020-10-07 DIAGNOSIS — Z7901 Long term (current) use of anticoagulants: Secondary | ICD-10-CM | POA: Diagnosis present

## 2020-10-07 LAB — CMP (CANCER CENTER ONLY)
ALT: 12 U/L (ref 0–44)
AST: 15 U/L (ref 15–41)
Albumin: 4.5 g/dL (ref 3.5–5.0)
Alkaline Phosphatase: 58 U/L (ref 38–126)
Anion gap: 6 (ref 5–15)
BUN: 19 mg/dL (ref 6–20)
CO2: 31 mmol/L (ref 22–32)
Calcium: 9.8 mg/dL (ref 8.9–10.3)
Chloride: 103 mmol/L (ref 98–111)
Creatinine: 0.9 mg/dL (ref 0.61–1.24)
GFR, Estimated: >60 mL/min (ref >60)
Glucose, Bld: 113 mg/dL — ABNORMAL HIGH (ref 70–99)
Potassium: 3.9 mmol/L (ref 3.5–5.1)
Sodium: 140 mmol/L (ref 135–145)
Total Bilirubin: 1 mg/dL (ref 0.3–1.2)
Total Protein: 7.8 g/dL (ref 6.5–8.1)

## 2020-10-07 LAB — CBC WITH DIFFERENTIAL (CANCER CENTER ONLY)
Abs Immature Granulocytes: 0.01 10*3/uL (ref 0.00–0.07)
Basophils Absolute: 0.1 10*3/uL (ref 0.0–0.1)
Basophils Relative: 1 %
Eosinophils Absolute: 0.2 10*3/uL (ref 0.0–0.5)
Eosinophils Relative: 3 %
HCT: 44 % (ref 39.0–52.0)
Hemoglobin: 15.3 g/dL (ref 13.0–17.0)
Immature Granulocytes: 0 %
Lymphocytes Relative: 38 %
Lymphs Abs: 2.2 10*3/uL (ref 0.7–4.0)
MCH: 29 pg (ref 26.0–34.0)
MCHC: 34.8 g/dL (ref 30.0–36.0)
MCV: 83.3 fL (ref 80.0–100.0)
Monocytes Absolute: 0.4 10*3/uL (ref 0.1–1.0)
Monocytes Relative: 6 %
Neutro Abs: 3 10*3/uL (ref 1.7–7.7)
Neutrophils Relative %: 52 %
Platelet Count: 219 10*3/uL (ref 150–400)
RBC: 5.28 MIL/uL (ref 4.22–5.81)
RDW: 12.3 % (ref 11.5–15.5)
WBC Count: 5.8 10*3/uL (ref 4.0–10.5)
nRBC: 0 % (ref 0.0–0.2)

## 2020-10-07 NOTE — Progress Notes (Signed)
Hematology and Oncology Follow Up Visit  Trevon Strothers [id number] Jun 13, 1986 34 y.o. 10/07/2020   Principle Diagnosis:  Extensive thromboembolic disease of the right leg - heterozygous for prothrombin II gene mutation  Past Therapy: Status post thrombectomy Lovenox 80 mg subcu twice daily x2 weeks  Current Therapy:        Pradaxa 150 mg PO twice daily - started Apr 20, 2019   Interim History:  Mr. Crevier is here today for follow-up. He is doing well but has had intermittent issues with sciatica in the left arm as well as the left leg. If this continues or become more frequent he will contact his PCP for further work up.  He still has some pain and swelling in his right thigh and leg that waxes and wanes. He is on his feet 13 hours a day for work which exacerbates his symptoms. He is unable to wear the thigh high compression stockings at work because they want to roll down. He wears them at night and states that this really helps. He will also try wearing a knee high compression stocking while at work for added support.  Pedal pulses are 3+. No redness or edema noted on exam.  He is doing well on Pradaxa and states that he is taking PO BID as prescribed. He has had no issues with blood loss. No abnormal bruising, no petechiae.  Lupus anticoagulant not detected in in April. Rechecked today, result pending.  No fever, chills, cough, rash, dizziness, SOB, chest pain, palpitations, abdominal pain or changes in bowel or bladder habits.  No falls or syncopal episodes to report.  He has maintained a good appetite and is staying well hydrated. His weight is stable at 193 lbs.   ECOG Performance Status: 1 - Symptomatic but completely ambulatory  Medications:  Allergies as of 10/07/2020   No Known Allergies     Medication List       Accurate as of October 07, 2020  8:20 AM. If you have any questions, ask your nurse or doctor.        Pradaxa 150 MG Caps capsule Generic drug:  dabigatran TAKE 1 CAPSULE BY MOUTH TWICE A DAY       Allergies: No Known Allergies  Past Medical History, Surgical history, Social history, and Family History were reviewed and updated.  Review of Systems: All other 10 point review of systems is negative.   Physical Exam:  vitals were not taken for this visit.   Wt Readings from Last 3 Encounters:  04/08/20 207 lb 6.4 oz (94.1 kg)  09/18/19 210 lb (95.3 kg)  06/25/19 211 lb (95.7 kg)    Ocular: Sclerae unicteric, pupils equal, round and reactive to light Ear-nose-throat: Oropharynx clear, dentition fair Lymphatic: No cervical or supraclavicular adenopathy Lungs no rales or rhonchi, good excursion bilaterally Heart regular rate and rhythm, no murmur appreciated Abd soft, nontender, positive bowel sounds MSK no focal spinal tenderness, no joint edema Neuro: non-focal, well-oriented, appropriate affect Breasts: Deferred   Lab Results  Component Value Date   WBC 5.8 10/07/2020   HGB 15.3 10/07/2020   HCT 44.0 10/07/2020   MCV 83.3 10/07/2020   PLT 219 10/07/2020   No results found for: FERRITIN, IRON, TIBC, UIBC, IRONPCTSAT Lab Results  Component Value Date   RBC 5.28 10/07/2020   No results found for: KPAFRELGTCHN, LAMBDASER, KAPLAMBRATIO No results found for: IGGSERUM, IGA, IGMSERUM No results found for: TOTALPROTELP, ALBUMINELP, A1GS, A2GS, BETS, BETA2SER, GAMS, MSPIKE, SPEI  Chemistry      Component Value Date/Time   NA 140 04/08/2020 0808   K 4.3 04/08/2020 0808   CL 106 04/08/2020 0808   CO2 30 04/08/2020 0808   BUN 17 04/08/2020 0808   CREATININE 0.92 04/08/2020 0808      Component Value Date/Time   CALCIUM 9.5 04/08/2020 0808   ALKPHOS 60 04/08/2020 0808   AST 17 04/08/2020 0808   ALT 15 04/08/2020 0808   BILITOT 0.7 04/08/2020 0808       Impression and Plan: Mr. Virden is a very pleasant 34 yo caucasian gentleman with extensive thromboembolic disease of the right leg - heterozygous for  prothrombin II gene mutation.  He has post phlebitic pain in the right leg which improves when wearing his compression stockings.  Lupus anticoagulant at last visit was not detected. Level checked again today.  He will continue his same regimen with Pradaxa BID. No changes at this time.  Follow-up in 6 months.  He was encouraged contact our office with any questions or concerns.   Emeline Gins, NP 10/22/20218:20 AM

## 2020-10-09 LAB — LUPUS ANTICOAGULANT PANEL
DRVVT: 50.5 s — ABNORMAL HIGH (ref 0.0–47.0)
PTT Lupus Anticoagulant: 47.2 s (ref 0.0–51.9)

## 2020-10-09 LAB — DRVVT CONFIRM: dRVVT Confirm: 1.1 ratio (ref 0.8–1.2)

## 2020-10-09 LAB — DRVVT MIX: dRVVT Mix: 45.6 s — ABNORMAL HIGH (ref 0.0–40.4)

## 2020-10-11 LAB — CARDIOLIPIN ANTIBODIES, IGG, IGM, IGA
Anticardiolipin IgA: <9 APL U/mL (ref 0–11)
Anticardiolipin IgG: 15 GPL U/mL — ABNORMAL HIGH (ref 0–14)
Anticardiolipin IgM: 14 MPL U/mL — ABNORMAL HIGH (ref 0–12)

## 2020-12-20 ENCOUNTER — Other Ambulatory Visit: Payer: Self-pay | Admitting: Hematology & Oncology

## 2021-02-09 ENCOUNTER — Other Ambulatory Visit: Payer: Self-pay

## 2021-02-10 ENCOUNTER — Ambulatory Visit (INDEPENDENT_AMBULATORY_CARE_PROVIDER_SITE_OTHER): Payer: 59

## 2021-02-10 ENCOUNTER — Ambulatory Visit: Payer: 59 | Admitting: Family Medicine

## 2021-02-10 ENCOUNTER — Encounter: Payer: Self-pay | Admitting: Family Medicine

## 2021-02-10 VITALS — BP 118/72 | Temp 97.8°F | Ht 72.0 in | Wt 206.8 lb

## 2021-02-10 DIAGNOSIS — G8929 Other chronic pain: Secondary | ICD-10-CM

## 2021-02-10 DIAGNOSIS — M5442 Lumbago with sciatica, left side: Secondary | ICD-10-CM

## 2021-02-10 MED ORDER — METHYLPREDNISOLONE 4 MG PO TBPK: ORAL_TABLET | ORAL | 0 refills | Status: DC

## 2021-02-10 MED ORDER — CYCLOBENZAPRINE HCL 10 MG PO TABS: 10.0000 mg | ORAL_TABLET | Freq: Three times a day (TID) | ORAL | 2 refills | Status: DC | PRN

## 2021-02-10 NOTE — Progress Notes (Signed)
   Subjective:    Patient ID: Oscar Kaiser, male    DOB: 1986/05/12, 35 y.o.   MRN: [id number]  HPI Here for worsening low back pain that started about 2 years ago. No hx of truama. The pain is worst in the left lower back and it radiates down the left leg to the foot. He also has numbness and tingling in the left leg at times. No weakness. Tylenol and stretches help a little bit.    Review of Systems  Constitutional: Negative.   Respiratory: Negative.   Cardiovascular: Negative.   Musculoskeletal: Positive for back pain.       Objective:   Physical Exam Constitutional:      Comments: In pain   Cardiovascular:     Rate and Rhythm: Normal rate and regular rhythm.     Pulses: Normal pulses.     Heart sounds: Normal heart sounds.  Pulmonary:     Effort: Pulmonary effort is normal.     Breath sounds: Normal breath sounds.  Musculoskeletal:     Comments: He is tender in the left lower back, not over the spine. He is also tender over the left sciatic notch. ROM is full. Negative SLR   Neurological:     Mental Status: He is alert.           Assessment & Plan:  Left sided low back pain with sciatica. He will try a Medrol dose pack and Flexeril. Get Xrays of the lumbar spine today.  Gershon Crane, MD

## 2021-02-14 NOTE — Addendum Note (Signed)
Addended by: Gershon Crane A on: 02/14/2021 12:55 PM   Modules accepted: Orders

## 2021-03-03 ENCOUNTER — Other Ambulatory Visit: Payer: 59

## 2021-03-16 ENCOUNTER — Other Ambulatory Visit: Payer: 59

## 2021-03-27 ENCOUNTER — Ambulatory Visit
Admission: RE | Admit: 2021-03-27 | Discharge: 2021-03-27 | Disposition: A | Discharge status: Home / Self Care | Payer: 59 | Source: Ambulatory Visit | Attending: Family Medicine | Admitting: Family Medicine

## 2021-03-27 ENCOUNTER — Other Ambulatory Visit: Payer: Self-pay

## 2021-03-27 DIAGNOSIS — G8929 Other chronic pain: Secondary | ICD-10-CM

## 2021-04-03 ENCOUNTER — Other Ambulatory Visit: Payer: Self-pay

## 2021-04-03 DIAGNOSIS — M5136 Other intervertebral disc degeneration, lumbar region: Secondary | ICD-10-CM

## 2021-04-03 DIAGNOSIS — M5126 Other intervertebral disc displacement, lumbar region: Secondary | ICD-10-CM

## 2021-04-03 DIAGNOSIS — G8929 Other chronic pain: Secondary | ICD-10-CM

## 2021-04-07 ENCOUNTER — Inpatient Hospital Stay: Payer: 59

## 2021-04-07 ENCOUNTER — Inpatient Hospital Stay: Payer: 59 | Attending: Hematology & Oncology | Admitting: Hematology & Oncology

## 2021-04-25 ENCOUNTER — Telehealth: Payer: Self-pay

## 2021-04-25 NOTE — Telephone Encounter (Signed)
Pt called in to r./s a no show appt from April   Oscar Kaiser

## 2021-05-04 ENCOUNTER — Ambulatory Visit: Payer: 59

## 2021-05-22 ENCOUNTER — Encounter: Payer: Self-pay | Admitting: Family Medicine

## 2021-05-22 ENCOUNTER — Telehealth (INDEPENDENT_AMBULATORY_CARE_PROVIDER_SITE_OTHER): Payer: 59 | Admitting: Family Medicine

## 2021-05-22 VITALS — Temp 101.0°F | Wt 206.0 lb

## 2021-05-22 DIAGNOSIS — U071 COVID-19: Secondary | ICD-10-CM | POA: Insufficient documentation

## 2021-05-22 MED ORDER — MOLNUPIRAVIR EUA 200MG CAPSULE: 4.0000 | ORAL_CAPSULE | Freq: Two times a day (BID) | ORAL | 0 refills | Status: AC

## 2021-05-22 NOTE — Progress Notes (Signed)
Subjective:    Patient ID: Oscar Kaiser, male    DOB: 11-06-1986, 35 y.o.   MRN: [id number]  HPI Virtual Visit via Video Note  I connected with the patient on 05/22/21 at  2:15 PM EDT by a video enabled telemedicine application and verified that I am speaking with the correct person using two identifiers.  Location patient: home Location provider:work or home office Persons participating in the virtual visit: patient, provider  I discussed the limitations of evaluation and management by telemedicine and the availability of in person appointments. The patient expressed understanding and agreed to proceed.   HPI: Here for a Covid-19 infection. His stepson went to a birthday party last week, and he became sick. Then over the weekend [place] and his fiance both developed fever, weakness, body aches, ST, and a dry cough. No SOB or NVD. They both tested positive for the Covid virus last night. He is drinking fluids and taking Dayquil.    ROS: See pertinent positives and negatives per HPI.  Past Medical History:  Diagnosis Date  . Back pain   . DVT (deep venous thrombosis) (HCC)     Past Surgical History:  Procedure Laterality Date  . CYSTOSCOPY N/A 03/20/2019   Procedure: CYSTOSCOPY FLEXIBLE;  Surgeon: Jerilee Field, MD;  Location: [facility];  Service: Urology;  Laterality: N/A;  . INTRAVASCULAR ULTRASOUND/IVUS N/A 04/03/2019   Procedure: Intravascular Ultrasound/IVUS;  Surgeon: Maeola Harman, MD;  Location: [facility] INVASIVE CV LAB;  Service: Cardiovascular;  Laterality: N/A;  . PERIPHERAL VASCULAR INTERVENTION  04/03/2019   Procedure: PERIPHERAL VASCULAR INTERVENTION;  Surgeon: Maeola Harman, MD;  Location: [facility] INVASIVE CV LAB;  Service: Cardiovascular;;  . PERIPHERAL VASCULAR THROMBECTOMY Right 04/03/2019   Procedure: PERIPHERAL VASCULAR THROMBECTOMY;  Surgeon: Maeola Harman, MD;  Location: [facility] INVASIVE CV LAB;  Service: Cardiovascular;  Laterality:  Right;  . REPAIR OF FRACTURED PENIS N/A 03/20/2019   Procedure: PENILE EXPLORATION AND REPAIR OF FRACTURED PENIS;  Surgeon: Jerilee Field, MD;  Location: [facility];  Service: Urology;  Laterality: N/A;    History reviewed. No pertinent family history.   Current Outpatient Medications:  .  molnupiravir EUA 200 mg CAPS, Take 4 capsules (800 mg total) by mouth 2 (two) times daily for 5 days., Disp: 40 capsule, Rfl: 0 .  PRADAXA 150 MG CAPS capsule, TAKE 1 CAPSULE BY MOUTH TWICE A DAY, Disp: 60 capsule, Rfl: 5  EXAM:  VITALS per patient if applicable:  GENERAL: alert, oriented, appears well and in no acute distress  HEENT: atraumatic, conjunttiva clear, no obvious abnormalities on inspection of external nose and ears  NECK: normal movements of the head and neck  LUNGS: on inspection no signs of respiratory distress, breathing rate appears normal, no obvious gross SOB, gasping or wheezing  CV: no obvious cyanosis  MS: moves all visible extremities without noticeable abnormality  PSYCH/NEURO: pleasant and cooperative, no obvious depression or anxiety, speech and thought processing grossly intact  ASSESSMENT AND PLAN: Covid-19 infection. He weill drink fluids and take Mucinex and Tylenol. We will add 5 days of Molnupiravir. He will quarantine at home for 5 days. Recheck as needed.  Gershon Crane, MD  Discussed the following assessment and plan:  No diagnosis found.     I discussed the assessment and treatment plan with the patient. The patient was provided an opportunity to ask questions and all were answered. The patient agreed with the plan and demonstrated an understanding of the instructions.   The patient  was advised to call back or seek an in-person evaluation if the symptoms worsen or if the condition fails to improve as anticipated.     Review of Systems     Objective:   Physical Exam        Assessment & Plan:

## 2021-06-13 ENCOUNTER — Other Ambulatory Visit: Payer: Self-pay | Admitting: Hematology & Oncology

## 2021-06-15 ENCOUNTER — Inpatient Hospital Stay: Payer: 59 | Admitting: Hematology & Oncology

## 2021-06-15 ENCOUNTER — Inpatient Hospital Stay: Payer: 59 | Attending: Hematology & Oncology

## 2021-06-23 ENCOUNTER — Ambulatory Visit: Payer: 59 | Attending: Family Medicine | Admitting: Physical Therapy

## 2021-06-29 ENCOUNTER — Telehealth: Payer: Self-pay

## 2021-07-13 ENCOUNTER — Telehealth: Payer: Self-pay

## 2021-07-13 ENCOUNTER — Encounter: Payer: Self-pay | Admitting: Hematology & Oncology

## 2021-07-13 ENCOUNTER — Inpatient Hospital Stay: Payer: 59 | Attending: Hematology & Oncology

## 2021-07-13 ENCOUNTER — Other Ambulatory Visit: Payer: Self-pay

## 2021-07-13 ENCOUNTER — Inpatient Hospital Stay: Payer: 59 | Admitting: Hematology & Oncology

## 2021-07-13 VITALS — BP 111/51 | HR 60 | Temp 99.0°F | Resp 16 | Wt 203.0 lb

## 2021-07-13 DIAGNOSIS — D6852 Prothrombin gene mutation: Secondary | ICD-10-CM | POA: Diagnosis present

## 2021-07-13 DIAGNOSIS — R102 Pelvic and perineal pain: Secondary | ICD-10-CM | POA: Diagnosis not present

## 2021-07-13 DIAGNOSIS — I82412 Acute embolism and thrombosis of left femoral vein: Secondary | ICD-10-CM

## 2021-07-13 DIAGNOSIS — M7989 Other specified soft tissue disorders: Secondary | ICD-10-CM | POA: Diagnosis not present

## 2021-07-13 DIAGNOSIS — Z7901 Long term (current) use of anticoagulants: Secondary | ICD-10-CM | POA: Diagnosis not present

## 2021-07-13 DIAGNOSIS — I82401 Acute embolism and thrombosis of unspecified deep veins of right lower extremity: Secondary | ICD-10-CM | POA: Diagnosis not present

## 2021-07-13 DIAGNOSIS — M2550 Pain in unspecified joint: Secondary | ICD-10-CM | POA: Insufficient documentation

## 2021-07-13 DIAGNOSIS — Z79899 Other long term (current) drug therapy: Secondary | ICD-10-CM | POA: Insufficient documentation

## 2021-07-13 DIAGNOSIS — I82512 Chronic embolism and thrombosis of left femoral vein: Secondary | ICD-10-CM | POA: Diagnosis not present

## 2021-07-13 DIAGNOSIS — R300 Dysuria: Secondary | ICD-10-CM | POA: Diagnosis not present

## 2021-07-13 LAB — CBC WITH DIFFERENTIAL (CANCER CENTER ONLY)
Abs Immature Granulocytes: 0.01 10*3/uL (ref 0.00–0.07)
Basophils Absolute: 0.1 10*3/uL (ref 0.0–0.1)
Basophils Relative: 2 %
Eosinophils Absolute: 0.2 10*3/uL (ref 0.0–0.5)
Eosinophils Relative: 3 %
HCT: 39.5 % (ref 39.0–52.0)
Hemoglobin: 13.5 g/dL (ref 13.0–17.0)
Immature Granulocytes: 0 %
Lymphocytes Relative: 37 %
Lymphs Abs: 1.8 10*3/uL (ref 0.7–4.0)
MCH: 28.8 pg (ref 26.0–34.0)
MCHC: 34.2 g/dL (ref 30.0–36.0)
MCV: 84.4 fL (ref 80.0–100.0)
Monocytes Absolute: 0.4 10*3/uL (ref 0.1–1.0)
Monocytes Relative: 8 %
Neutro Abs: 2.4 10*3/uL (ref 1.7–7.7)
Neutrophils Relative %: 50 %
Platelet Count: 187 10*3/uL (ref 150–400)
RBC: 4.68 MIL/uL (ref 4.22–5.81)
RDW: 12.6 % (ref 11.5–15.5)
WBC Count: 4.9 10*3/uL (ref 4.0–10.5)
nRBC: 0 % (ref 0.0–0.2)

## 2021-07-13 LAB — CMP (CANCER CENTER ONLY)
ALT: 15 U/L (ref 0–44)
AST: 18 U/L (ref 15–41)
Albumin: 4.3 g/dL (ref 3.5–5.0)
Alkaline Phosphatase: 53 U/L (ref 38–126)
Anion gap: 5 (ref 5–15)
BUN: 16 mg/dL (ref 6–20)
CO2: 25 mmol/L (ref 22–32)
Calcium: 8.8 mg/dL — ABNORMAL LOW (ref 8.9–10.3)
Chloride: 106 mmol/L (ref 98–111)
Creatinine: 0.87 mg/dL (ref 0.61–1.24)
GFR, Estimated: >60 mL/min (ref >60)
Glucose, Bld: 96 mg/dL (ref 70–99)
Potassium: 4.1 mmol/L (ref 3.5–5.1)
Sodium: 136 mmol/L (ref 135–145)
Total Bilirubin: 1.6 mg/dL — ABNORMAL HIGH (ref 0.3–1.2)
Total Protein: 7.3 g/dL (ref 6.5–8.1)

## 2021-07-13 LAB — D-DIMER, QUANTITATIVE: D-Dimer, Quant: <0.27 ug/mL-FEU (ref 0.00–0.50)

## 2021-07-13 MED ORDER — DABIGATRAN ETEXILATE MESYLATE 110 MG PO CAPS: 110.0000 mg | ORAL_CAPSULE | Freq: Two times a day (BID) | ORAL | 4 refills | Status: DC

## 2021-07-13 NOTE — Progress Notes (Signed)
Hematology and Oncology Follow Up Visit  Taven Strite [id number] 30-May-1986 35 y.o. 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 twice daily --to start on Apr 20, 2019  --changed to 110 mg p.o. twice daily on 07/13/2021     Interim History:  Mr. Anglin is back for his follow-up.  The last time that we saw him was back in October of last year.  He has been doing quite well.  The big news is that he is going to get married in September.  He is going get married on September 25.  He is doing well at work.  He just got a raise.  I am so happy for him.  He is doing well on the Pradaxa.  He has been on full dose Pradaxa for 2 years now.  I think we might be able to cut his dose back to a more maintenance dose.  I think this would be very reasonable.    There is no problems with bleeding.  He has had no abdominal pain.  There is no issues with bowels or bladder.  He did have COVID.  He had a couple months ago.  He was not hospitalized.  His wife also had COVID.  Currently, I would say his performance status is ECOG 0.    Medications:  Current Outpatient Medications:    PRADAXA 150 MG CAPS capsule, TAKE 1 CAPSULE BY MOUTH TWICE A DAY, Disp: 60 capsule, Rfl: 1  Allergies: No Known Allergies  Past Medical History, Surgical history, Social history, and Family History were reviewed and updated.  Review of Systems: Review of Systems  Constitutional: Negative.   HENT:  Negative.    Eyes: Negative.   Respiratory: Negative.    Cardiovascular: Negative.   Gastrointestinal: Negative.   Endocrine: Negative.   Genitourinary:  Positive for dysuria and pelvic pain.   Musculoskeletal:  Positive for arthralgias.  Skin: Negative.   Neurological: Negative.   Psychiatric/Behavioral: Negative.      Physical Exam:  weight is 203 lb (92.1 kg). His oral temperature is 99 F (37.2 C). His blood pressure is 111/51 (abnormal) and his pulse is 60. His respiration is 16 and oxygen saturation is 97%.   Wt Readings from Last 3 Encounters:  07/13/21 203 lb (92.1 kg)  05/22/21 206 lb (93.4 kg)  02/10/21 206 lb 12.8 oz (93.8 kg)    Physical Exam Vitals reviewed.  HENT:     Head: Normocephalic and atraumatic.  Eyes:     Pupils: Pupils are equal, round, and reactive to light.  Cardiovascular:     Rate and Rhythm: Normal rate and regular rhythm.     Heart sounds: Normal heart sounds.  Pulmonary:     Effort: Pulmonary effort is normal.     Breath sounds: Normal breath sounds.  Abdominal:     General: Bowel sounds are normal.     Palpations: Abdomen is soft.  Musculoskeletal:        General: No tenderness or deformity. Normal range of motion.     Cervical back: Normal range of motion.     Comments: His extremities shows a compression stocking on the right leg.  This is a thigh-high compression stocking.  He does have some slight swelling of the right thigh.  There is some slight tenderness in the  right calf muscle where he had the surgical site for the thrombectomy.  He has good pulses in his distal extremities.  Left leg is unremarkable.  Lymphadenopathy:     Cervical: No cervical adenopathy.  Skin:    General: Skin is warm and dry.     Findings: No erythema or rash.  Neurological:     Mental Status: He is alert and oriented to person, place, and time.  Psychiatric:        Behavior: Behavior normal.        Thought Content: Thought content normal.        Judgment: Judgment normal.     Lab Results  Component Value Date   WBC 4.9 07/13/2021   HGB 13.5 07/13/2021   HCT 39.5 07/13/2021   MCV 84.4 07/13/2021   PLT 187 07/13/2021     Chemistry      Component Value Date/Time   NA 136 07/13/2021 1325   K 4.1 07/13/2021 1325   CL 106 07/13/2021 1325   CO2 25 07/13/2021 1325    BUN 16 07/13/2021 1325   CREATININE 0.87 07/13/2021 1325      Component Value Date/Time   CALCIUM 8.8 (L) 07/13/2021 1325   ALKPHOS 53 07/13/2021 1325   AST 18 07/13/2021 1325   ALT 15 07/13/2021 1325   BILITOT 1.6 (H) 07/13/2021 1325       Impression and Plan: Mr. Ing is a 35 year old white male.  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  He has been doing well on the Pradaxa.  I will see about decreasing his dose of Pradaxa down to maybe 110 mg twice a day.  I think this would be very reasonable.  I do not see that we have to do any kind of Dopplers on him.  Clinically he is doing quite nicely.  I will plan to get him back in another 6 months.  I think this would be reasonable.  We will have to see how his wedding went.    Josph Macho, MD 7/28/20222:03 PM

## 2021-07-13 NOTE — Telephone Encounter (Signed)
Appts made and aavs printed for pt per 07/13/21 los  [employer]

## 2021-07-14 LAB — DRVVT CONFIRM: dRVVT Confirm: 1.4 ratio — ABNORMAL HIGH (ref 0.8–1.2)

## 2021-07-14 LAB — PTT-LA MIX: PTT-LA Mix: 51.6 s — ABNORMAL HIGH (ref 0.0–48.9)

## 2021-07-14 LAB — LUPUS ANTICOAGULANT PANEL
DRVVT: 95 s — ABNORMAL HIGH (ref 0.0–47.0)
PTT Lupus Anticoagulant: 54.8 s — ABNORMAL HIGH (ref 0.0–51.9)

## 2021-07-14 LAB — HEXAGONAL PHASE PHOSPHOLIPID: Hexagonal Phase Phospholipid: 10 s (ref 0–11)

## 2021-07-14 LAB — DRVVT MIX: dRVVT Mix: 71.2 s — ABNORMAL HIGH (ref 0.0–40.4)

## 2021-07-18 ENCOUNTER — Other Ambulatory Visit: Payer: Self-pay | Admitting: Hematology & Oncology

## 2021-07-18 LAB — CARDIOLIPIN ANTIBODIES, IGG, IGM, IGA
Anticardiolipin IgA: <9 APL U/mL (ref 0–11)
Anticardiolipin IgG: 9 GPL U/mL (ref 0–14)
Anticardiolipin IgM: 9 MPL U/mL (ref 0–12)

## 2021-08-01 ENCOUNTER — Other Ambulatory Visit: Payer: Self-pay | Admitting: Hematology & Oncology

## 2021-11-06 ENCOUNTER — Ambulatory Visit: Payer: 59 | Admitting: Family Medicine

## 2021-11-14 ENCOUNTER — Encounter: Payer: Self-pay | Admitting: Family Medicine

## 2021-11-14 ENCOUNTER — Ambulatory Visit (INDEPENDENT_AMBULATORY_CARE_PROVIDER_SITE_OTHER): Payer: 59 | Admitting: Family Medicine

## 2021-11-14 VITALS — BP 108/70 | HR 62 | Temp 98.4°F | Wt 209.4 lb

## 2021-11-14 DIAGNOSIS — N451 Epididymitis: Secondary | ICD-10-CM | POA: Diagnosis not present

## 2021-11-14 DIAGNOSIS — R319 Hematuria, unspecified: Secondary | ICD-10-CM | POA: Diagnosis not present

## 2021-11-14 LAB — POC URINALSYSI DIPSTICK (AUTOMATED)
Bilirubin, UA: NEGATIVE
Blood, UA: NEGATIVE
Glucose, UA: NEGATIVE
Ketones, UA: NEGATIVE
Leukocytes, UA: NEGATIVE
Nitrite, UA: NEGATIVE
Protein, UA: NEGATIVE
Spec Grav, UA: 1.02 (ref 1.010–1.025)
Urobilinogen, UA: 0.2 E.U./dL
pH, UA: 6 (ref 5.0–8.0)

## 2021-11-14 MED ORDER — DOXYCYCLINE HYCLATE 100 MG PO CAPS: 100.0000 mg | ORAL_CAPSULE | Freq: Two times a day (BID) | ORAL | 0 refills | Status: AC

## 2021-11-14 NOTE — Progress Notes (Signed)
   Subjective:    Patient ID: Oscar Kaiser, male    DOB: 07/09/1986, 35 y.o.   MRN: [id number]  HPI Here for 3 months of intermittent pain in the left testicle and seeing blood in the urine. No fever or nausea. No recent trauma. No urinary frequency or urgency. He went to an urgent care 10 days ago and was diagnosed with epididymitis. He was given 10 days of Levaquin, which he finished yesterday. This helped a little, but the symptoms remain. A UA today is clear.    Review of Systems  Constitutional: Negative.   Respiratory: Negative.    Cardiovascular: Negative.   Gastrointestinal: Negative.   Genitourinary:  Positive for hematuria and testicular pain. Negative for difficulty urinating, dysuria, flank pain, frequency, penile discharge and urgency.      Objective:   Physical Exam Constitutional:      Appearance: Normal appearance.  Cardiovascular:     Rate and Rhythm: Normal rate and regular rhythm.     Pulses: Normal pulses.     Heart sounds: Normal heart sounds.  Pulmonary:     Effort: Pulmonary effort is normal.     Breath sounds: Normal breath sounds.  Genitourinary:    Penis: Normal.      Comments: The left epididymus and testicle are mildly tender. No swelling.  Neurological:     Mental Status: He is alert.          Assessment & Plan:  Partially treated epididymitis. We will give him 14 days of Doxycycline BID. Recheck as needed.  Gershon Crane, MD

## 2022-01-08 ENCOUNTER — Encounter: Payer: Self-pay | Admitting: Hematology & Oncology

## 2022-01-08 ENCOUNTER — Other Ambulatory Visit: Payer: Self-pay

## 2022-01-08 ENCOUNTER — Inpatient Hospital Stay (HOSPITAL_BASED_OUTPATIENT_CLINIC_OR_DEPARTMENT_OTHER): Payer: 59 | Admitting: Hematology & Oncology

## 2022-01-08 ENCOUNTER — Inpatient Hospital Stay: Payer: 59 | Attending: Hematology & Oncology

## 2022-01-08 VITALS — BP 109/86 | HR 59 | Temp 97.9°F | Resp 18 | Wt 208.0 lb

## 2022-01-08 DIAGNOSIS — R200 Anesthesia of skin: Secondary | ICD-10-CM | POA: Insufficient documentation

## 2022-01-08 DIAGNOSIS — M79602 Pain in left arm: Secondary | ICD-10-CM | POA: Diagnosis not present

## 2022-01-08 DIAGNOSIS — I82401 Acute embolism and thrombosis of unspecified deep veins of right lower extremity: Secondary | ICD-10-CM

## 2022-01-08 LAB — CBC WITH DIFFERENTIAL (CANCER CENTER ONLY)
Abs Immature Granulocytes: 0.01 10*3/uL (ref 0.00–0.07)
Basophils Absolute: 0.1 10*3/uL (ref 0.0–0.1)
Basophils Relative: 2 %
Eosinophils Absolute: 0.2 10*3/uL (ref 0.0–0.5)
Eosinophils Relative: 3 %
HCT: 44 % (ref 39.0–52.0)
Hemoglobin: 14.9 g/dL (ref 13.0–17.0)
Immature Granulocytes: 0 %
Lymphocytes Relative: 32 %
Lymphs Abs: 1.7 10*3/uL (ref 0.7–4.0)
MCH: 28.5 pg (ref 26.0–34.0)
MCHC: 33.9 g/dL (ref 30.0–36.0)
MCV: 84.3 fL (ref 80.0–100.0)
Monocytes Absolute: 0.4 10*3/uL (ref 0.1–1.0)
Monocytes Relative: 7 %
Neutro Abs: 3 10*3/uL (ref 1.7–7.7)
Neutrophils Relative %: 56 %
Platelet Count: 210 10*3/uL (ref 150–400)
RBC: 5.22 MIL/uL (ref 4.22–5.81)
RDW: 12.4 % (ref 11.5–15.5)
WBC Count: 5.4 10*3/uL (ref 4.0–10.5)
nRBC: 0 % (ref 0.0–0.2)

## 2022-01-08 LAB — CMP (CANCER CENTER ONLY)
ALT: 12 U/L (ref 0–44)
AST: 15 U/L (ref 15–41)
Albumin: 4.5 g/dL (ref 3.5–5.0)
Alkaline Phosphatase: 63 U/L (ref 38–126)
Anion gap: 5 (ref 5–15)
BUN: 14 mg/dL (ref 6–20)
CO2: 29 mmol/L (ref 22–32)
Calcium: 9.7 mg/dL (ref 8.9–10.3)
Chloride: 105 mmol/L (ref 98–111)
Creatinine: 0.84 mg/dL (ref 0.61–1.24)
GFR, Estimated: >60 mL/min (ref >60)
Glucose, Bld: 95 mg/dL (ref 70–99)
Potassium: 4.2 mmol/L (ref 3.5–5.1)
Sodium: 139 mmol/L (ref 135–145)
Total Bilirubin: 1.4 mg/dL — ABNORMAL HIGH (ref 0.3–1.2)
Total Protein: 7.3 g/dL (ref 6.5–8.1)

## 2022-01-08 NOTE — Progress Notes (Signed)
Hematology and Oncology Follow Up Visit  Oscar Kaiser [id number] 1986-02-10 36 y.o. 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 twice daily --to start on Apr 20, 2019  --changed to 110 mg p.o. twice daily on 07/13/2021     Interim History:  Oscar Kaiser is back for his follow-up.  He is now married man.  He got married in September.  He and his wife are now looking for a house.  Hopefully they will be able to find one.  [place] are looking in the [place] area.  He has had no problems with bleeding.  He is on lower dose of Pradaxa at 110 mg p.o. twice daily.  He has noted on occasion some numbness in pain in the left arm.  This is been going on for couple weeks.  There is no weakness.  He says he just does some stretching.  It sounds like he may be a little bit of a pinched nerve.  He says he had epididymitis recently.  He was on Levaquin for 4 weeks.  He still has little bit of testicular discomfort.  I know that he has a urologist.  He could certainly make an appoint with the urologist to be seen.  He is working.  He is working quite hard.  He is quite busy at work.  He has had no change in bowel or bladder habits.  He has had no cough or shortness of breath.  There is been no issues with COVID.  Overall, his performance status is ECOG 0.    Medications:  Current Outpatient Medications:    Dabigatran Etexilate Mesylate (PRADAXA) 110 MG CAPS, Take 110 mg by mouth 2 (two) times daily., Disp: 180 capsule, Rfl: 4  Allergies: No Known Allergies  Past Medical History, Surgical history, Social history, and Family History were reviewed and updated.  Review of Systems: Review of Systems  Constitutional: Negative.   HENT:  Negative.    Eyes: Negative.   Respiratory: Negative.     Cardiovascular: Negative.   Gastrointestinal: Negative.   Endocrine: Negative.   Genitourinary:  Positive for dysuria and pelvic pain.   Musculoskeletal:  Positive for arthralgias.  Skin: Negative.   Neurological: Negative.   Psychiatric/Behavioral: Negative.     Physical Exam:  weight is 208 lb (94.3 kg). His oral temperature is 97.9 F (36.6 C). His blood pressure is 109/86 and his pulse is 59 (abnormal). His respiration is 18 and oxygen saturation is 100%.   Wt Readings from Last 3 Encounters:  01/08/22 208 lb (94.3 kg)  11/14/21 209 lb 6 oz (95 kg)  07/13/21 203 lb (92.1 kg)    Physical Exam Vitals reviewed.  HENT:     Head: Normocephalic and atraumatic.  Eyes:     Pupils: Pupils are equal, round, and reactive to light.  Cardiovascular:     Rate and Rhythm: Normal rate and regular rhythm.     Heart sounds: Normal heart sounds.  Pulmonary:     Effort: Pulmonary effort is normal.     Breath sounds: Normal breath sounds.  Abdominal:     General: Bowel sounds are normal.     Palpations: Abdomen is soft.  Musculoskeletal:        General: No tenderness or deformity. Normal range of motion.     Cervical back:  Normal range of motion.     Comments: His extremities shows a compression stocking on the right leg.  This is a thigh-high compression stocking.  He does have some slight swelling of the right thigh.  There is some slight tenderness in the right calf muscle where he had the surgical site for the thrombectomy.  He has good pulses in his distal extremities.  Left leg is unremarkable.  Lymphadenopathy:     Cervical: No cervical adenopathy.  Skin:    General: Skin is warm and dry.     Findings: No erythema or rash.  Neurological:     Mental Status: He is alert and oriented to person, place, and time.  Psychiatric:        Behavior: Behavior normal.        Thought Content: Thought content normal.        Judgment: Judgment normal.     Lab Results  Component Value  Date   WBC 5.4 01/08/2022   HGB 14.9 01/08/2022   HCT 44.0 01/08/2022   MCV 84.3 01/08/2022   PLT 210 01/08/2022     Chemistry      Component Value Date/Time   NA 139 01/08/2022 0949   K 4.2 01/08/2022 0949   CL 105 01/08/2022 0949   CO2 29 01/08/2022 0949   BUN 14 01/08/2022 0949   CREATININE 0.84 01/08/2022 0949      Component Value Date/Time   CALCIUM 9.7 01/08/2022 0949   ALKPHOS 63 01/08/2022 0949   AST 15 01/08/2022 0949   ALT 12 01/08/2022 0949   BILITOT 1.4 (H) 01/08/2022 0949       Impression and Plan: Oscar Kaiser is a 36 year old white male.  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  He has been doing well on the Pradaxa.  We will not make any changes with his dosing.  For now, we will keep him on the Pradaxa.  I think the real issue is he will need lifelong in my opinion.  He really had a huge/extensive thrombus.  Does have the prothrombin 2 gene mutation.  We will plan to get him back to see [place] in another 6 months.      Volanda Napoleon, MD 1/23/202311:26 AM

## 2022-01-09 ENCOUNTER — Ambulatory Visit: Payer: 59 | Admitting: Hematology & Oncology

## 2022-01-09 ENCOUNTER — Other Ambulatory Visit: Payer: 59

## 2022-01-09 LAB — CARDIOLIPIN ANTIBODIES, IGG, IGM, IGA
Anticardiolipin IgA: <9 APL U/mL (ref 0–11)
Anticardiolipin IgG: 9 GPL U/mL (ref 0–14)
Anticardiolipin IgM: 10 MPL U/mL (ref 0–12)

## 2022-01-09 LAB — LUPUS ANTICOAGULANT PANEL
DRVVT: 37.6 s (ref 0.0–47.0)
PTT Lupus Anticoagulant: 35.6 s (ref 0.0–51.9)

## 2022-07-09 ENCOUNTER — Inpatient Hospital Stay: Payer: 59 | Admitting: Hematology & Oncology

## 2022-07-09 ENCOUNTER — Inpatient Hospital Stay: Payer: 59

## 2022-07-19 ENCOUNTER — Other Ambulatory Visit: Payer: Self-pay | Admitting: Hematology & Oncology

## 2022-07-20 ENCOUNTER — Other Ambulatory Visit (HOSPITAL_BASED_OUTPATIENT_CLINIC_OR_DEPARTMENT_OTHER): Payer: Self-pay

## 2022-07-20 ENCOUNTER — Other Ambulatory Visit: Payer: Self-pay | Admitting: *Deleted

## 2022-07-20 ENCOUNTER — Other Ambulatory Visit: Payer: Self-pay | Admitting: Hematology & Oncology

## 2022-07-20 ENCOUNTER — Telehealth: Payer: Self-pay | Admitting: *Deleted

## 2022-07-20 MED ORDER — DABIGATRAN ETEXILATE MESYLATE 150 MG PO CAPS
150.0000 mg | ORAL_CAPSULE | Freq: Two times a day (BID) | ORAL | 1 refills | Status: DC
Start: 2022-07-20 — End: 2022-10-31
  Filled 2022-07-20: qty 60, 30d supply, fill #0

## 2022-07-20 MED ORDER — PRADAXA 110 MG PO CAPS: 110.0000 mg | ORAL_CAPSULE | Freq: Two times a day (BID) | ORAL | 4 refills | Status: DC

## 2022-07-20 NOTE — Telephone Encounter (Signed)
Message received from patient stating that he is in need of a refill of Pradaxa.  He states that he has checked with multiple pharmacies and that  Pradaxa 110 mg is on backorder and not available at this time.  Call placed back to patient and patient notified that Pradaxa 150 mg is available at the [facility] and that Dr. Myna Hidalgo would like for him to take the 150 mg dose BID until the 110 mg dose becomes available.  Pt is agreeable to coming to the [facility] pharmacy to pick up prescription for Pradaxa 150 mg to take twice a day. Teach back done.  Pt is appreciative of assistance and has no further questions at this time.

## 2022-10-31 ENCOUNTER — Inpatient Hospital Stay: Payer: 59 | Attending: Hematology & Oncology

## 2022-10-31 ENCOUNTER — Encounter: Payer: Self-pay | Admitting: Hematology & Oncology

## 2022-10-31 ENCOUNTER — Inpatient Hospital Stay: Payer: 59 | Admitting: Hematology & Oncology

## 2022-10-31 VITALS — BP 113/76 | HR 57 | Temp 97.9°F | Resp 20 | Ht 72.0 in | Wt 233.0 lb

## 2022-10-31 DIAGNOSIS — Z86718 Personal history of other venous thrombosis and embolism: Secondary | ICD-10-CM | POA: Insufficient documentation

## 2022-10-31 DIAGNOSIS — I82401 Acute embolism and thrombosis of unspecified deep veins of right lower extremity: Secondary | ICD-10-CM

## 2022-10-31 DIAGNOSIS — D6852 Prothrombin gene mutation: Secondary | ICD-10-CM | POA: Diagnosis not present

## 2022-10-31 DIAGNOSIS — Z7901 Long term (current) use of anticoagulants: Secondary | ICD-10-CM | POA: Insufficient documentation

## 2022-10-31 LAB — CBC WITH DIFFERENTIAL (CANCER CENTER ONLY)
Abs Immature Granulocytes: 0.01 10*3/uL (ref 0.00–0.07)
Basophils Absolute: 0.1 10*3/uL (ref 0.0–0.1)
Basophils Relative: 1 %
Eosinophils Absolute: 0.2 10*3/uL (ref 0.0–0.5)
Eosinophils Relative: 3 %
HCT: 39.1 % (ref 39.0–52.0)
Hemoglobin: 13.4 g/dL (ref 13.0–17.0)
Immature Granulocytes: 0 %
Lymphocytes Relative: 29 %
Lymphs Abs: 1.8 10*3/uL (ref 0.7–4.0)
MCH: 28.8 pg (ref 26.0–34.0)
MCHC: 34.3 g/dL (ref 30.0–36.0)
MCV: 83.9 fL (ref 80.0–100.0)
Monocytes Absolute: 0.5 10*3/uL (ref 0.1–1.0)
Monocytes Relative: 8 %
Neutro Abs: 3.6 10*3/uL (ref 1.7–7.7)
Neutrophils Relative %: 59 %
Platelet Count: 219 10*3/uL (ref 150–400)
RBC: 4.66 MIL/uL (ref 4.22–5.81)
RDW: 12.9 % (ref 11.5–15.5)
WBC Count: 6.1 10*3/uL (ref 4.0–10.5)
nRBC: 0 % (ref 0.0–0.2)

## 2022-10-31 LAB — CMP (CANCER CENTER ONLY)
ALT: 13 U/L (ref 0–44)
AST: 18 U/L (ref 15–41)
Albumin: 4.7 g/dL (ref 3.5–5.0)
Alkaline Phosphatase: 61 U/L (ref 38–126)
Anion gap: 7 (ref 5–15)
BUN: 22 mg/dL — ABNORMAL HIGH (ref 6–20)
CO2: 28 mmol/L (ref 22–32)
Calcium: 9.6 mg/dL (ref 8.9–10.3)
Chloride: 103 mmol/L (ref 98–111)
Creatinine: 0.93 mg/dL (ref 0.61–1.24)
GFR, Estimated: >60 mL/min (ref >60)
Glucose, Bld: 106 mg/dL — ABNORMAL HIGH (ref 70–99)
Potassium: 4 mmol/L (ref 3.5–5.1)
Sodium: 138 mmol/L (ref 135–145)
Total Bilirubin: 1.3 mg/dL — ABNORMAL HIGH (ref 0.3–1.2)
Total Protein: 7.6 g/dL (ref 6.5–8.1)

## 2022-10-31 NOTE — Progress Notes (Signed)
Hematology and Oncology Follow Up Visit  Oscar Kaiser [id number] 08-23-1986 36 y.o. 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 twice daily --to start on Apr 20, 2019  --changed to 110 mg p.o. twice daily on 07/13/2021     Interim History:  Oscar Kaiser is back for his follow-up.  We last saw him back in January.  Since then, he has been doing quite well.  He and his wife are thinking about having a baby.  His wife, unfortunately, has epilepsy.  As such, having a baby might be a little bit tricky.  He is still working.  He is working full-time.  He is quite busy at work.  He is on Pradaxa.  He is doing well with the Pradaxa.  He is on a maintenance dose.  He has had no bleeding.  He has had no pain in the legs.  There may be a little bit of swelling in the right leg, on occasion.  He has had no nausea or vomiting.  There is no change in bowel or bladder habits.  He has had no problems with COVID.  Overall, I would say his performance status is probably ECOG 0.     Medications:  Current Outpatient Medications:    Dabigatran Etexilate Mesylate (PRADAXA) 110 MG CAPS, Take 110 mg by mouth 2 (two) times daily., Disp: 180 capsule, Rfl: 4  Allergies: No Known Allergies  Past Medical History, Surgical history, Social history, and Family History were reviewed and updated.  Review of Systems: Review of Systems  Constitutional: Negative.   HENT:  Negative.    Eyes: Negative.   Respiratory: Negative.    Cardiovascular: Negative.   Gastrointestinal: Negative.   Endocrine: Negative.   Genitourinary:  Positive for dysuria and pelvic pain.   Musculoskeletal:  Positive for arthralgias.  Skin: Negative.   Neurological: Negative.   Psychiatric/Behavioral: Negative.      Physical Exam:   height is 6' (1.829 m) and weight is 233 lb (105.7 kg). His oral temperature is 97.9 F (36.6 C). His blood pressure is 113/76 and his pulse is 57 (abnormal). His respiration is 20 and oxygen saturation is 100%.   Wt Readings from Last 3 Encounters:  10/31/22 233 lb (105.7 kg)  01/08/22 208 lb (94.3 kg)  11/14/21 209 lb 6 oz (95 kg)    Physical Exam Vitals reviewed.  HENT:     Head: Normocephalic and atraumatic.  Eyes:     Pupils: Pupils are equal, round, and reactive to light.  Cardiovascular:     Rate and Rhythm: Normal rate and regular rhythm.     Heart sounds: Normal heart sounds.  Pulmonary:     Effort: Pulmonary effort is normal.     Breath sounds: Normal breath sounds.  Abdominal:     General: Bowel sounds are normal.     Palpations: Abdomen is soft.  Musculoskeletal:        General: No tenderness or deformity. Normal range of motion.     Cervical back: Normal range of motion.     Comments: His extremities shows a compression stocking on the right leg.  This is a thigh-high compression stocking.  He does have some slight swelling of the right thigh.  There is some slight tenderness in the right calf muscle where he had the  surgical site for the thrombectomy.  He has good pulses in his distal extremities.  Left leg is unremarkable.  Lymphadenopathy:     Cervical: No cervical adenopathy.  Skin:    General: Skin is warm and dry.     Findings: No erythema or rash.  Neurological:     Mental Status: He is alert and oriented to person, place, and time.  Psychiatric:        Behavior: Behavior normal.        Thought Content: Thought content normal.        Judgment: Judgment normal.      Lab Results  Component Value Date   WBC 6.1 10/31/2022   HGB 13.4 10/31/2022   HCT 39.1 10/31/2022   MCV 83.9 10/31/2022   PLT 219 10/31/2022     Chemistry      Component Value Date/Time   NA 138 10/31/2022 0909   K 4.0 10/31/2022 0909   CL 103 10/31/2022 0909   CO2 28 10/31/2022  0909   BUN 22 (H) 10/31/2022 0909   CREATININE 0.93 10/31/2022 0909      Component Value Date/Time   CALCIUM 9.6 10/31/2022 0909   ALKPHOS 61 10/31/2022 0909   AST 18 10/31/2022 0909   ALT 13 10/31/2022 0909   BILITOT 1.3 (H) 10/31/2022 0909       Impression and Plan: Oscar Kaiser is a 36 year old white male.  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  I thanked him for his service.  Sure that he had a Financial controller.  I do not see any problems with him being on Pradaxa.  I would keep him on lifelong Pradaxa given the extent of his thrombus when he presented.  We will plan to get him back yearly now.  He knows that he can always give [place] a call if he has any problems before then.    Josph Macho, MD 11/15/20239:57 AM

## 2022-11-16 ENCOUNTER — Ambulatory Visit: Payer: 59 | Admitting: Family Medicine

## 2022-11-20 ENCOUNTER — Ambulatory Visit: Payer: 59 | Admitting: Family Medicine

## 2022-11-20 ENCOUNTER — Encounter: Payer: Self-pay | Admitting: Family Medicine

## 2022-11-20 VITALS — BP 98/60 | HR 61 | Temp 98.3°F | Wt 217.0 lb

## 2022-11-20 DIAGNOSIS — K582 Mixed irritable bowel syndrome: Secondary | ICD-10-CM

## 2022-11-20 DIAGNOSIS — R1010 Upper abdominal pain, unspecified: Secondary | ICD-10-CM | POA: Diagnosis not present

## 2022-11-20 NOTE — Progress Notes (Signed)
   Subjective:    Patient ID: Oscar Kaiser, male    DOB: 16-Sep-1986, 36 y.o.   MRN: [id number]  HPI Here to follow up an urgent care visit on 11-14-22 for abdominal issues. He typically does not have abdominal problems but several weeks ago he developed upper abdominal pain and bloating. He would often pass excess gas. He rarely has nausea, although jsut before the UC visit he had 2 days of fever and vomiting. This resolved and he has had no nausea since then. Along with the pain and bloating, he felt constipated and has stools became drier, harder, and much smaller. He describes them as "pebbles". Now over the past week the stools have become looser than normal although the amount of stool has increased. He attributes this change to the changes he has made in his diet. He is avoiding red meat and fatty foods, and he is eating more vegetables, fruits, chicken and salmon. He still hs some upper abdominal pains after eating a meal, but these are much milder than before.    Review of Systems  Constitutional: Negative.   Respiratory: Negative.    Cardiovascular: Negative.   Gastrointestinal:  Positive for abdominal distention, abdominal pain and constipation. Negative for blood in stool, diarrhea, nausea, rectal pain and vomiting.  Genitourinary: Negative.        Objective:   Physical Exam Constitutional:      Appearance: Normal appearance. He is not ill-appearing.  Cardiovascular:     Rate and Rhythm: Normal rate and regular rhythm.     Pulses: Normal pulses.     Heart sounds: Normal heart sounds.  Pulmonary:     Effort: Pulmonary effort is normal.     Breath sounds: Normal breath sounds.  Abdominal:     General: Abdomen is flat. Bowel sounds are normal. There is no distension.     Palpations: Abdomen is soft. There is no mass.     Tenderness: There is no abdominal tenderness. There is no guarding or rebound.     Hernia: No hernia is present.  Neurological:     Mental Status: He  is alert.           Assessment & Plan:  He likely has some IBS, with alterations of diarrhea and constipation. We will schedule an abdominal US to rule out gall bladder disease. He will continue with the healthy diet, and he can try taking a probiotic daily. We spent a total of (32   ) minutes reviewing records and discussing these issues.  Gershon Crane, MD

## 2023-03-05 IMAGING — MR MR LUMBAR SPINE W/O CM
4 of 5 series · 28 of 48 positions shown · non-contrast
Comparison: Plain films lumbar spine 02/10/2021.

CLINICAL DATA: Chronic low back pain radiating into both legs.

EXAM:
MRI LUMBAR SPINE WITHOUT CONTRAST
TECHNIQUE: Multiplanar, multisequence MR imaging of the lumbar spine was
performed. No intravenous contrast was administered.

[Series 3: T2 · sagittal · 4.0mm · 1.09mm/px · 7 of 18 slices shown (1 of 2)]
[im 1/18]
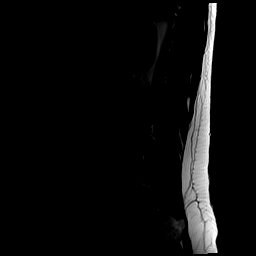
[im 3/18]
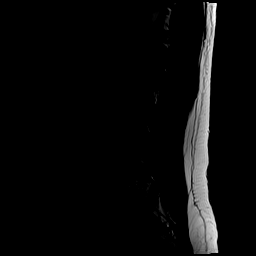
[im 6/18]
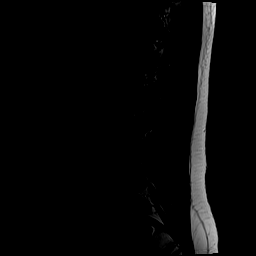
[im 9/18]
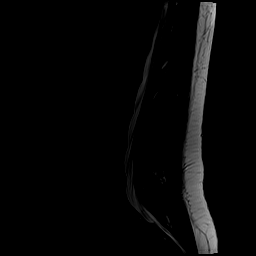
[im 12/18]
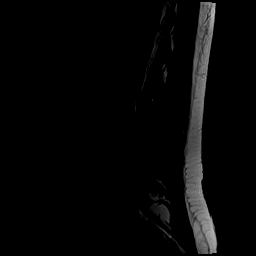
[im 15/18]
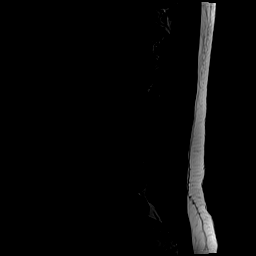
[im 18/18]
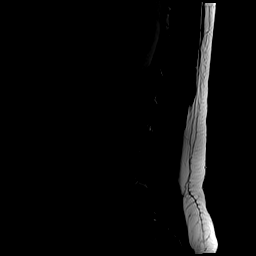

[Series 5: T1 · sagittal · 4.0mm · 1.09mm/px · 6 of 18 slices shown (1 of 2)]
[im 1/18]
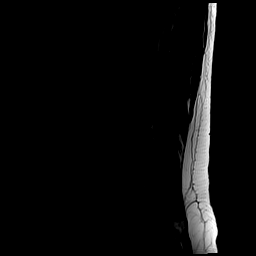
[im 4/18]
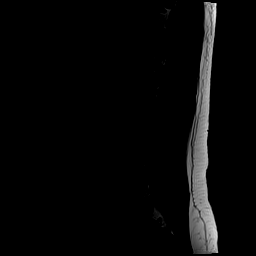
[im 7/18]
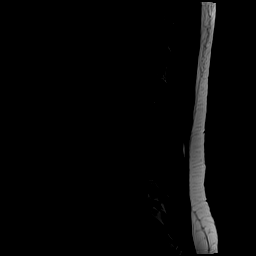
[im 11/18]
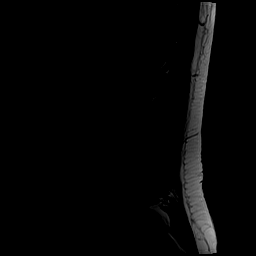
[im 14/18]
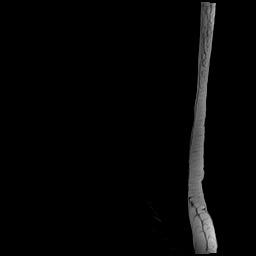
[im 18/18]
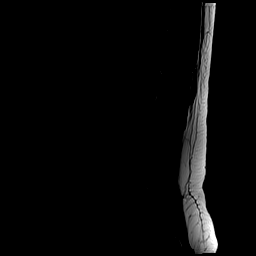

[Series 6: T2 · axial · 4.0mm · 0.39mm/px · z∈[-134,+117]mm · 8 of 41 slices shown (2 of 2)]
[im 1/41]
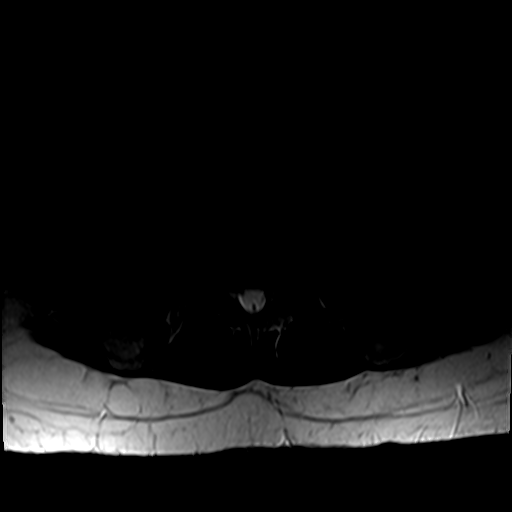
[im 7/41]
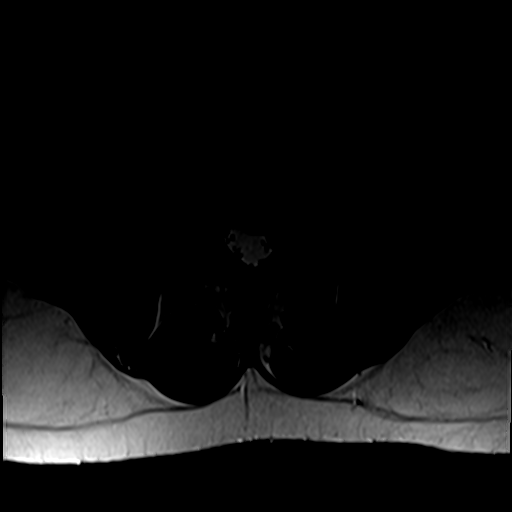
[im 13/41]
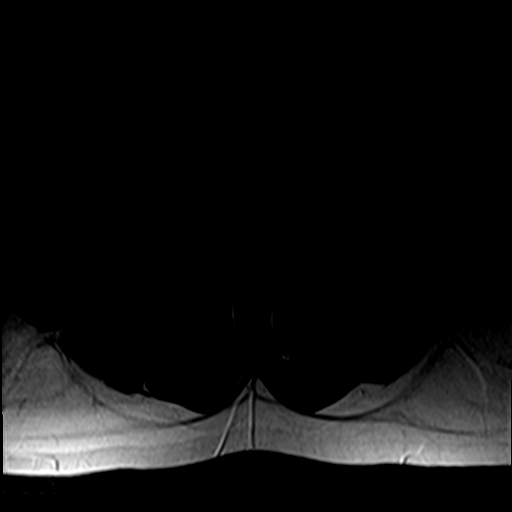
[im 19/41]
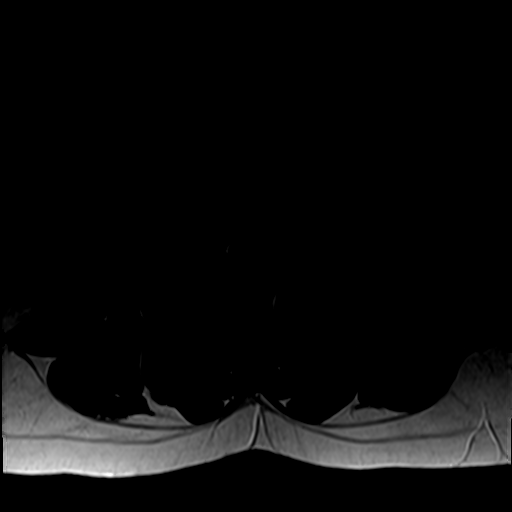
[im 22/41]
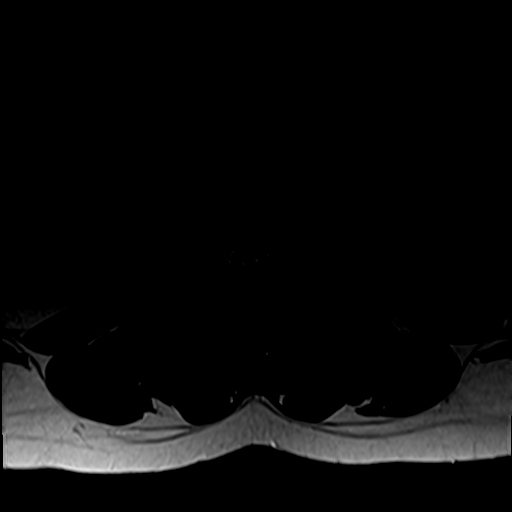
[im 28/41]
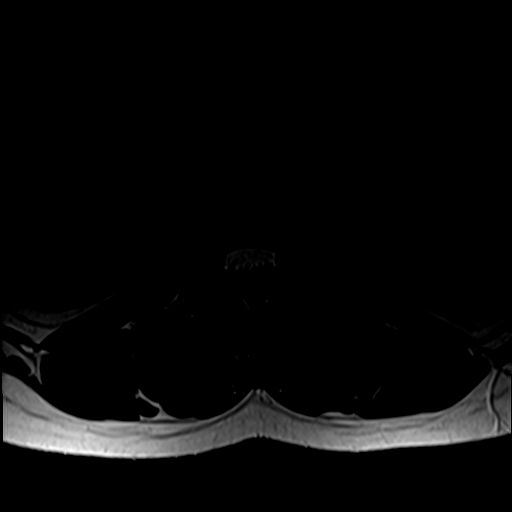
[im 34/41]
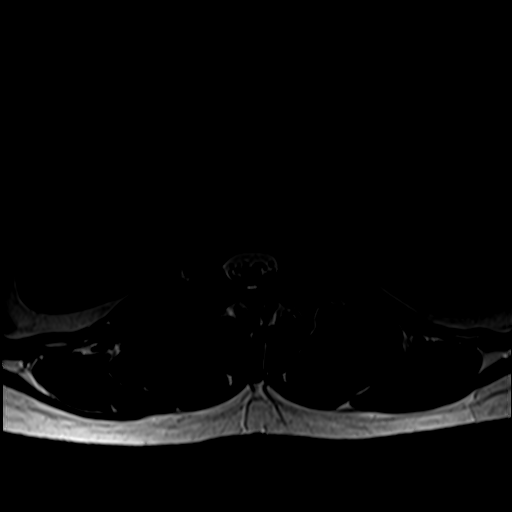
[im 41/41]
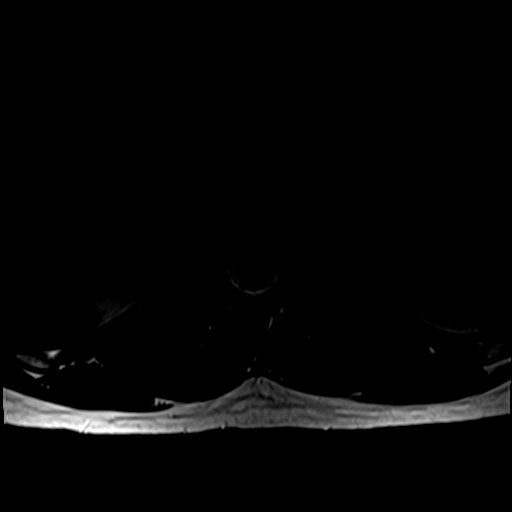

[Series 8: T1 · axial · 4.0mm · 0.39mm/px · z∈[-134,+84]mm · 7 of 41 slices shown (2 of 2)]
[im 1/41]
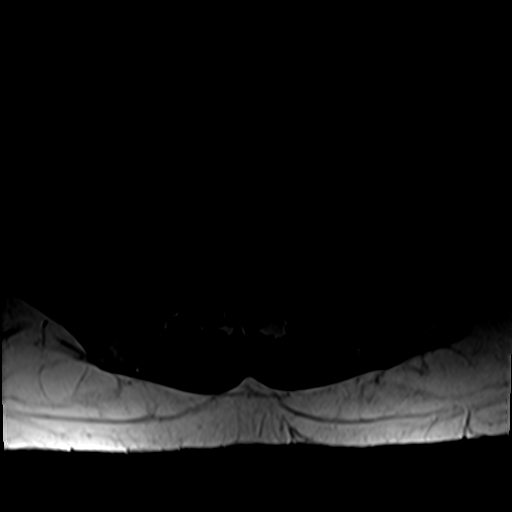
[im 7/41]
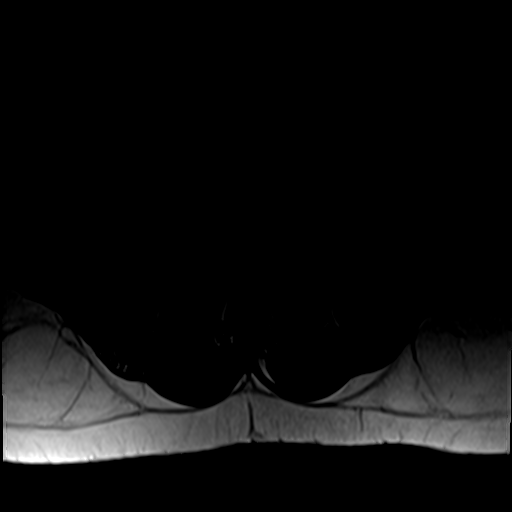
[im 13/41]
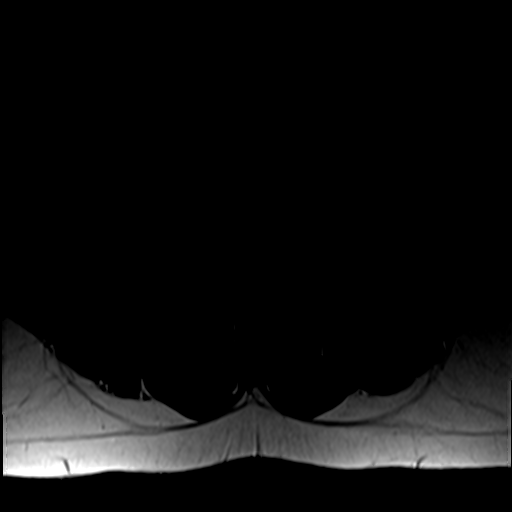
[im 19/41]
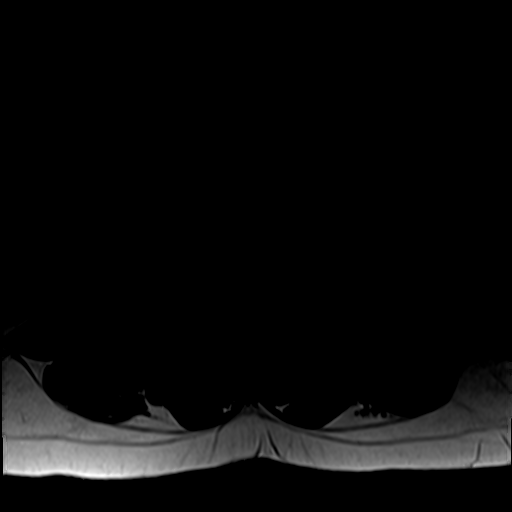
[im 22/41]
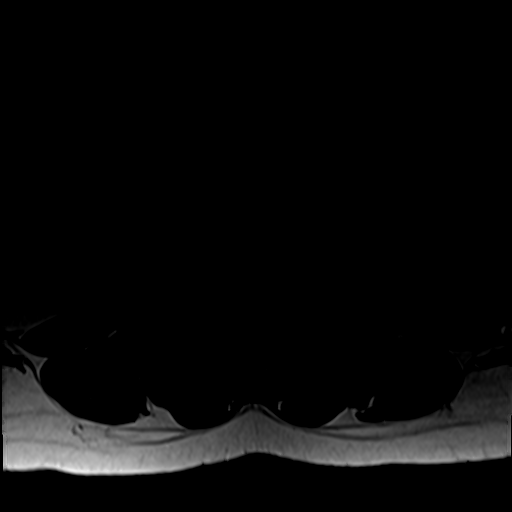
[im 28/41]
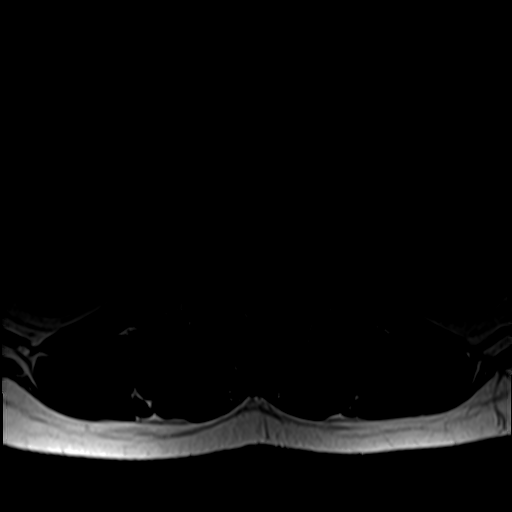
[im 34/41]
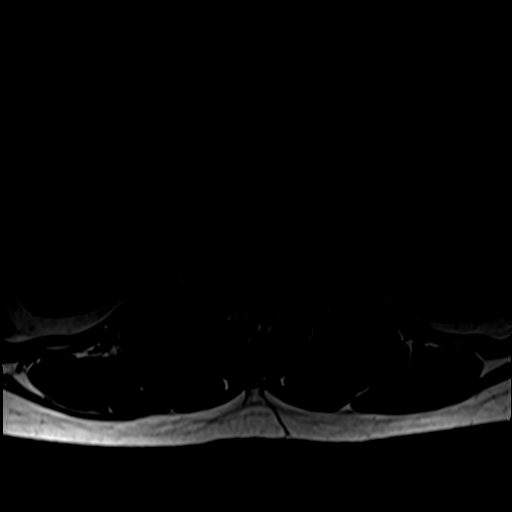

[28 of 48 positions shown; findings below may reference images not displayed]

FINDINGS: Segmentation:  Standard.

Alignment:  Normal.

Vertebrae:  No fracture, evidence of discitis, or bone lesion.

Conus medullaris and cauda equina: Conus extends to the L1-2 level.
Conus and cauda equina appear normal.

Paraspinal and other soft tissues: Normal.

Disc levels:

T12-L1: Negative.

L1-2: Negative.

L2-3: Negative.

L3-4: Negative.

L4-5: Very shallow disc bulge without stenosis.

L5-S1: Shallow disc bulge slightly more prominent to the left
without stenosis.
IMPRESSION: Mild disc bulging L4-5 and L5-S1 without central canal or foraminal
stenosis. The exam is otherwise negative.

## 2023-04-13 ENCOUNTER — Other Ambulatory Visit: Payer: Self-pay | Admitting: Hematology & Oncology

## 2023-10-13 ENCOUNTER — Other Ambulatory Visit: Payer: Self-pay | Admitting: Hematology & Oncology

## 2023-11-01 ENCOUNTER — Other Ambulatory Visit: Payer: 59

## 2023-11-01 ENCOUNTER — Ambulatory Visit: Payer: 59 | Admitting: Hematology & Oncology

## 2023-11-27 ENCOUNTER — Other Ambulatory Visit: Payer: Self-pay

## 2023-11-27 DIAGNOSIS — I82401 Acute embolism and thrombosis of unspecified deep veins of right lower extremity: Secondary | ICD-10-CM

## 2023-11-28 ENCOUNTER — Inpatient Hospital Stay: Payer: 59 | Admitting: Hematology & Oncology

## 2023-11-28 ENCOUNTER — Encounter: Payer: Self-pay | Admitting: Hematology & Oncology

## 2023-11-28 ENCOUNTER — Inpatient Hospital Stay: Payer: 59 | Attending: Hematology & Oncology

## 2023-11-28 VITALS — BP 120/67 | HR 54 | Temp 98.4°F | Resp 20 | Ht 72.0 in | Wt 204.0 lb

## 2023-11-28 DIAGNOSIS — Z7902 Long term (current) use of antithrombotics/antiplatelets: Secondary | ICD-10-CM | POA: Insufficient documentation

## 2023-11-28 DIAGNOSIS — I82411 Acute embolism and thrombosis of right femoral vein: Secondary | ICD-10-CM

## 2023-11-28 DIAGNOSIS — I82401 Acute embolism and thrombosis of unspecified deep veins of right lower extremity: Secondary | ICD-10-CM

## 2023-11-28 DIAGNOSIS — D6852 Prothrombin gene mutation: Secondary | ICD-10-CM | POA: Insufficient documentation

## 2023-11-28 LAB — CMP (CANCER CENTER ONLY)
ALT: 13 U/L (ref 0–44)
AST: 17 U/L (ref 15–41)
Albumin: 4.4 g/dL (ref 3.5–5.0)
Alkaline Phosphatase: 59 U/L (ref 38–126)
Anion gap: 7 (ref 5–15)
BUN: 14 mg/dL (ref 6–20)
CO2: 30 mmol/L (ref 22–32)
Calcium: 9.4 mg/dL (ref 8.9–10.3)
Chloride: 101 mmol/L (ref 98–111)
Creatinine: 0.86 mg/dL (ref 0.61–1.24)
GFR, Estimated: >60 mL/min (ref >60)
Glucose, Bld: 90 mg/dL (ref 70–99)
Potassium: 4.1 mmol/L (ref 3.5–5.1)
Sodium: 138 mmol/L (ref 135–145)
Total Bilirubin: 1.3 mg/dL — ABNORMAL HIGH (ref <1.2)
Total Protein: 7.6 g/dL (ref 6.5–8.1)

## 2023-11-28 LAB — CBC WITH DIFFERENTIAL (CANCER CENTER ONLY)
Abs Immature Granulocytes: 0.02 10*3/uL (ref 0.00–0.07)
Basophils Absolute: 0.1 10*3/uL (ref 0.0–0.1)
Basophils Relative: 1 %
Eosinophils Absolute: 0.2 10*3/uL (ref 0.0–0.5)
Eosinophils Relative: 3 %
HCT: 41.9 % (ref 39.0–52.0)
Hemoglobin: 14 g/dL (ref 13.0–17.0)
Immature Granulocytes: 0 %
Lymphocytes Relative: 38 %
Lymphs Abs: 2.7 10*3/uL (ref 0.7–4.0)
MCH: 28.3 pg (ref 26.0–34.0)
MCHC: 33.4 g/dL (ref 30.0–36.0)
MCV: 84.8 fL (ref 80.0–100.0)
Monocytes Absolute: 0.4 10*3/uL (ref 0.1–1.0)
Monocytes Relative: 6 %
Neutro Abs: 3.7 10*3/uL (ref 1.7–7.7)
Neutrophils Relative %: 52 %
Platelet Count: 262 10*3/uL (ref 150–400)
RBC: 4.94 MIL/uL (ref 4.22–5.81)
RDW: 12.7 % (ref 11.5–15.5)
WBC Count: 7.1 10*3/uL (ref 4.0–10.5)
nRBC: 0 % (ref 0.0–0.2)

## 2023-11-28 NOTE — Progress Notes (Signed)
Hematology and Oncology Follow Up Visit  Oscar Kaiser [id number] 1986-11-30 37 y.o. 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 twice daily --to start on Apr 20, 2019  --changed to 110 mg p.o. twice daily on 07/13/2021     Interim History:  Oscar Kaiser is back for his follow-up.  We saw him a year ago.  He has been doing pretty well.  So far, everything is doing fine.  He is working.  He is quite busy.  He is on the Pradaxa.  He is doing well with the Pradaxa as long as he can get it from the pharmacy.  He has had no problems with bleeding.  There is been no problems with bowels or bladder.  He has had no issues with his right leg.  Has been no pain or swelling in the right leg.  He has had no cough or shortness of breath.  He had no chest wall pain.    Thankfully, he has had no problems with COVID.  Overall, his performance status is ECOG 0.    Medications:  Current Outpatient Medications:    PRADAXA 110 MG CAPS, TAKE 110 MG BY MOUTH 2 (TWO) TIMES DAILY., Disp: 180 capsule, Rfl: 4  Allergies: No Known Allergies  Past Medical History, Surgical history, Social history, and Family History were reviewed and updated.  Review of Systems: Review of Systems  Constitutional: Negative.   HENT:  Negative.    Eyes: Negative.   Respiratory: Negative.    Cardiovascular: Negative.   Gastrointestinal: Negative.   Endocrine: Negative.   Genitourinary:  Positive for dysuria and pelvic pain.   Musculoskeletal:  Positive for arthralgias.  Skin: Negative.   Neurological: Negative.   Psychiatric/Behavioral: Negative.      Physical Exam:  height is 6' (1.829 m) and weight is 204 lb (92.5 kg). His oral temperature is 98.4 F (36.9 C). His blood pressure is 120/67 and his pulse is 54  (abnormal). His respiration is 20 and oxygen saturation is 97%.   Wt Readings from Last 3 Encounters:  11/28/23 204 lb (92.5 kg)  11/20/22 217 lb (98.4 kg)  10/31/22 233 lb (105.7 kg)    Physical Exam Vitals reviewed.  HENT:     Head: Normocephalic and atraumatic.  Eyes:     Pupils: Pupils are equal, round, and reactive to light.  Cardiovascular:     Rate and Rhythm: Normal rate and regular rhythm.     Heart sounds: Normal heart sounds.  Pulmonary:     Effort: Pulmonary effort is normal.     Breath sounds: Normal breath sounds.  Abdominal:     General: Bowel sounds are normal.     Palpations: Abdomen is soft.  Musculoskeletal:        General: No tenderness or deformity. Normal range of motion.     Cervical back: Normal range of motion.     Comments: His extremities shows a compression stocking on the right leg.  This is a thigh-high compression stocking.  He does have some slight swelling of the right thigh.  There is some slight tenderness in the right calf muscle where he had the surgical site for the thrombectomy.  He has good pulses in his distal extremities.  Left leg is unremarkable.  Lymphadenopathy:     Cervical: No cervical  adenopathy.  Skin:    General: Skin is warm and dry.     Findings: No erythema or rash.  Neurological:     Mental Status: He is alert and oriented to person, place, and time.  Psychiatric:        Behavior: Behavior normal.        Thought Content: Thought content normal.        Judgment: Judgment normal.      Lab Results  Component Value Date   WBC 7.1 11/28/2023   HGB 14.0 11/28/2023   HCT 41.9 11/28/2023   MCV 84.8 11/28/2023   PLT 262 11/28/2023     Chemistry      Component Value Date/Time   NA 138 10/31/2022 0909   K 4.0 10/31/2022 0909   CL 103 10/31/2022 0909   CO2 28 10/31/2022 0909   BUN 22 (H) 10/31/2022 0909   CREATININE 0.93 10/31/2022 0909      Component Value Date/Time   CALCIUM 9.6 10/31/2022 0909   ALKPHOS 61  10/31/2022 0909   AST 18 10/31/2022 0909   ALT 13 10/31/2022 0909   BILITOT 1.3 (H) 10/31/2022 0909       Impression and Plan: Oscar Kaiser is a 37 year old white male.  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    So far, everything is doing quite well for him.  I really do not see a problem with respect to his thromboembolic disease.  He is doing well on the Pradaxa.  I really think is going need lifelong Pradaxa given the extent of his thrombus and the fact that he does carry the prothrombin 2 gene mutation.  As always, we will plan to get him back in 1 year.    Josph Macho, MD 12/12/20243:38 PM

## 2023-12-25 ENCOUNTER — Telehealth: Payer: Self-pay | Admitting: Family Medicine

## 2023-12-25 NOTE — Telephone Encounter (Signed)
 LM for pt to call and schedule appt if needeed

## 2024-10-17 ENCOUNTER — Other Ambulatory Visit: Payer: Self-pay | Admitting: Hematology & Oncology

## 2024-11-27 ENCOUNTER — Ambulatory Visit: Payer: 59 | Admitting: Hematology & Oncology

## 2024-11-27 ENCOUNTER — Inpatient Hospital Stay: Payer: 59 | Attending: Hematology & Oncology
